# Patient Record
Sex: Female | Born: 1965
Health system: Southern US, Community
[De-identification: ages and names within clinical notes are randomized; demographics above are authoritative.]

## PROBLEM LIST (undated history)

## (undated) DIAGNOSIS — D649 Anemia, unspecified: Secondary | ICD-10-CM

## (undated) DIAGNOSIS — I10 Essential (primary) hypertension: Secondary | ICD-10-CM

## (undated) HISTORY — DX: Essential (primary) hypertension: I10

## (undated) HISTORY — DX: Anemia, unspecified: D64.9

## (undated) HISTORY — PX: COSMETIC SURGERY: SHX468

---

## 2018-02-27 ENCOUNTER — Ambulatory Visit: Payer: 59 | Admitting: Family Medicine

## 2018-02-27 ENCOUNTER — Encounter: Payer: Self-pay | Admitting: Family Medicine

## 2018-02-27 ENCOUNTER — Other Ambulatory Visit: Payer: Self-pay

## 2018-02-27 VITALS — BP 221/106 | HR 99 | Temp 99.2°F | Ht 65.0 in | Wt 160.6 lb

## 2018-02-27 DIAGNOSIS — N898 Other specified noninflammatory disorders of vagina: Secondary | ICD-10-CM

## 2018-02-27 DIAGNOSIS — I10 Essential (primary) hypertension: Secondary | ICD-10-CM

## 2018-02-27 DIAGNOSIS — Z113 Encounter for screening for infections with a predominantly sexual mode of transmission: Secondary | ICD-10-CM | POA: Diagnosis not present

## 2018-02-27 LAB — POCT URINALYSIS DIP (MANUAL ENTRY)
Bilirubin, UA: NEGATIVE
Blood, UA: NEGATIVE
Glucose, UA: NEGATIVE mg/dL
Ketones, POC UA: NEGATIVE mg/dL
Leukocytes, UA: NEGATIVE
Nitrite, UA: NEGATIVE
Protein Ur, POC: NEGATIVE mg/dL
Spec Grav, UA: 1.02 (ref 1.010–1.025)
Urobilinogen, UA: 0.2 E.U./dL
pH, UA: 7 (ref 5.0–8.0)

## 2018-02-27 MED ORDER — AMLODIPINE BESYLATE 10 MG PO TABS: 10.0000 mg | ORAL_TABLET | Freq: Every day | ORAL | 2 refills | Status: DC

## 2018-02-27 MED ORDER — CHLORTHALIDONE 25 MG PO TABS: 25.0000 mg | ORAL_TABLET | Freq: Every day | ORAL | 2 refills | Status: DC

## 2018-02-27 NOTE — Patient Instructions (Addendum)
   IF you received an x-ray today, you will receive an invoice from [facility]. Please contact [facility] at [phone] with questions or concerns regarding your invoice.   IF you received labwork today, you will receive an invoice from [facility]. Please contact [facility] at [phone] with questions or concerns regarding your invoice.   Our billing staff will not be able to assist you with questions regarding bills from these companies.  You will be contacted with the lab results as soon as they are available. The fastest way to get your results is to activate your My Chart account. Instructions are located on the last page of this paperwork. If you have not heard from us regarding the results in 2 weeks, please contact this office.      Managing Your Hypertension Hypertension is commonly called high blood pressure. This is when the force of your blood pressing against the walls of your arteries is too strong. Arteries are blood vessels that carry blood from your heart throughout your body. Hypertension forces the heart to work harder to pump blood, and may cause the arteries to become narrow or stiff. Having untreated or uncontrolled hypertension can cause heart attack, stroke, kidney disease, and other problems. What are blood pressure readings? A blood pressure reading consists of a higher number over a lower number. Ideally, your blood pressure should be below 120/80. The first ("top") number is called the systolic pressure. It is a measure of the pressure in your arteries as your heart beats. The second ("bottom") number is called the diastolic pressure. It is a measure of the pressure in your arteries as the heart relaxes. What does my blood pressure reading mean? Blood pressure is classified into four stages. Based on your blood pressure reading, your health care provider may use the following stages to determine what type of treatment you need, if any. Systolic  pressure and diastolic pressure are measured in a unit called mm Hg. Normal  Systolic pressure: below 120.  Diastolic pressure: below 80. Elevated  Systolic pressure: 120-129.  Diastolic pressure: below 80. Hypertension stage 1  Systolic pressure: 130-139.  Diastolic pressure: 80-89. Hypertension stage 2  Systolic pressure: 140 or above.  Diastolic pressure: 90 or above. What health risks are associated with hypertension? Managing your hypertension is an important responsibility. Uncontrolled hypertension can lead to:  A heart attack.  A stroke.  A weakened blood vessel (aneurysm).  Heart failure.  Kidney damage.  Eye damage.  Metabolic syndrome.  Memory and concentration problems.  What changes can I make to manage my hypertension? Hypertension can be managed by making lifestyle changes and possibly by taking medicines. Your health care provider will help you make a plan to bring your blood pressure within a normal range. Eating and drinking  Eat a diet that is high in fiber and potassium, and low in salt (sodium), added sugar, and fat. An example eating plan is called the DASH (Dietary Approaches to Stop Hypertension) diet. To eat this way: ? Eat plenty of fresh fruits and vegetables. Try to fill half of your plate at each meal with fruits and vegetables. ? Eat whole grains, such as whole wheat pasta, brown rice, or whole grain bread. Fill about one quarter of your plate with whole grains. ? Eat low-fat diary products. ? Avoid fatty cuts of meat, processed or cured meats, and poultry with skin. Fill about one quarter of your plate with lean proteins such as fish, chicken without skin, beans, eggs,   and tofu. ? Avoid premade and processed foods. These tend to be higher in sodium, added sugar, and fat.  Reduce your daily sodium intake. Most people with hypertension should eat less than 1,500 mg of sodium a day.  Limit alcohol intake to no more than 1 drink a day  for nonpregnant women and 2 drinks a day for men. One drink equals 12 oz of beer, 5 oz of wine, or 1 oz of hard liquor. Lifestyle  Work with your health care provider to maintain a healthy body weight, or to lose weight. Ask what an ideal weight is for you.  Get at least 30 minutes of exercise that causes your heart to beat faster (aerobic exercise) most days of the week. Activities may include walking, swimming, or biking.  Include exercise to strengthen your muscles (resistance exercise), such as weight lifting, as part of your weekly exercise routine. Try to do these types of exercises for 30 minutes at least 3 days a week.  Do not use any products that contain nicotine or tobacco, such as cigarettes and e-cigarettes. If you need help quitting, ask your health care provider.  Control any long-term (chronic) conditions you have, such as high cholesterol or diabetes. Monitoring  Monitor your blood pressure at home as told by your health care provider. Your personal target blood pressure may vary depending on your medical conditions, your age, and other factors.  Have your blood pressure checked regularly, as often as told by your health care provider. Working with your health care provider  Review all the medicines you take with your health care provider because there may be side effects or interactions.  Talk with your health care provider about your diet, exercise habits, and other lifestyle factors that may be contributing to hypertension.  Visit your health care provider regularly. Your health care provider can help you create and adjust your plan for managing hypertension. Will I need medicine to control my blood pressure? Your health care provider may prescribe medicine if lifestyle changes are not enough to get your blood pressure under control, and if:  Your systolic blood pressure is 130 or higher.  Your diastolic blood pressure is 80 or higher.  Take medicines only as told  by your health care provider. Follow the directions carefully. Blood pressure medicines must be taken as prescribed. The medicine does not work as well when you skip doses. Skipping doses also puts you at risk for problems. Contact a health care provider if:  You think you are having a reaction to medicines you have taken.  You have repeated (recurrent) headaches.  You feel dizzy.  You have swelling in your ankles.  You have trouble with your vision. Get help right away if:  You develop a severe headache or confusion.  You have unusual weakness or numbness, or you feel faint.  You have severe pain in your chest or abdomen.  You vomit repeatedly.  You have trouble breathing. Summary  Hypertension is when the force of blood pumping through your arteries is too strong. If this condition is not controlled, it may put you at risk for serious complications.  Your personal target blood pressure may vary depending on your medical conditions, your age, and other factors. For most people, a normal blood pressure is less than 120/80.  Hypertension is managed by lifestyle changes, medicines, or both. Lifestyle changes include weight loss, eating a healthy, low-sodium diet, exercising more, and limiting alcohol. This information is not intended to replace advice   given to you by your health care provider. Make sure you discuss any questions you have with your health care provider. Document Released: 06/13/2012 Document Revised: 08/17/2016 Document Reviewed: 08/17/2016 Elsevier Interactive Patient Education  2018 Elsevier Inc.  

## 2018-02-27 NOTE — Progress Notes (Signed)
5/28/20199:41 AM  Jenny Brock May 18, 1966, 52 y.o. female [id number]  Chief Complaint  Patient presents with  . Hypertension    needs to est care    HPI:   Patient is a 52 y.o. 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 Would like to be tested for STD  Fall Risk  02/27/2018  Falls in the past year? No     Depression screen PHQ 2/9 02/27/2018  Decreased Interest 0  Down, Depressed, Hopeless 0  PHQ - 2 Score 0    No Known Allergies  Prior to Admission medications   Not on File    Past Medical History:  Diagnosis Date  . Anemia   . Hypertension     Past Surgical History:  Procedure Laterality Date  . CESAREAN SECTION    . COSMETIC SURGERY      Social History   Tobacco Use  . Smoking status: Never Smoker  . Smokeless tobacco: Never Used  Substance Use Topics  . Alcohol use: Yes    Family History  Problem Relation Age of Onset  . Diabetes Mother   . Heart disease Mother   . Hyperlipidemia Mother   . Hypertension Mother   . Cancer Father   . Heart disease Maternal Grandmother   . Hyperlipidemia Maternal Grandmother   . Hypertension Maternal Grandmother     Review of Systems  Constitutional: Negative for chills, fever and malaise/fatigue.  Eyes: Negative for blurred vision and double vision.  Respiratory: Negative for cough and shortness of breath.   Cardiovascular: Negative for chest pain, palpitations, orthopnea, leg swelling and PND.  Gastrointestinal: Negative for abdominal pain, nausea and  vomiting.  Neurological: Positive for dizziness. Negative for sensory change, speech change, focal weakness and headaches.  All other systems reviewed and are negative.    OBJECTIVE:  Blood pressure (!) 221/106, pulse 99, temperature 99.2 F (37.3 C), temperature source Oral, height  (1.651 m), weight 160 lb 9.6 oz (72.8 kg), SpO2 99 %.  Physical Exam  Constitutional: She is oriented to person, place, and time. She appears well-developed and well-nourished.  HENT:  Head: Normocephalic and atraumatic.  Right Ear: Hearing, tympanic membrane, external ear and ear canal normal.  Left Ear: Hearing, tympanic membrane, external ear and ear canal normal.  Mouth/Throat: Oropharynx is clear and moist.  Eyes: Pupils are equal, round, and reactive to light. Conjunctivae and EOM are normal.  Neck: Neck supple. No thyromegaly present.  Cardiovascular: Normal rate, regular rhythm, normal heart sounds and intact distal pulses. Exam reveals no gallop and no friction rub.  No murmur heard. Pulmonary/Chest: Effort normal and breath sounds normal. She has no wheezes. She has no rales.  Abdominal: Soft. Bowel sounds are normal. She exhibits no distension and no mass. There is no tenderness.  Musculoskeletal: Normal range of motion. She exhibits no edema.  Lymphadenopathy:    She has no cervical adenopathy.  Neurological: She is alert and oriented to person, place, and time. She has normal reflexes. No  cranial nerve deficit. Gait normal.  Skin: Skin is warm and dry.  Psychiatric: She has a normal mood and affect.  Nursing note and vitals reviewed.   Results for orders placed or performed in visit on 02/27/18 (from the past 24 hour(s))  POCT urinalysis dipstick     Status: Normal   Collection Time: 02/27/18 11:04 AM  Result Value Ref Range   Color, UA yellow yellow   Clarity, UA clear clear   Glucose, UA negative negative mg/dL   Bilirubin, UA negative negative   Ketones, POC UA negative  negative mg/dL   Spec Grav, UA 4.401 0.272 - 1.025   Blood, UA negative negative   pH, UA 7.0 5.0 - 8.0   Protein Ur, POC negative negative mg/dL   Urobilinogen, UA 0.2 0.2 or 1.0 E.U./dL   Nitrite, UA Negative Negative   Leukocytes, UA Negative Negative    My interpretation of EKG:  Sinus, HR 102, normal intervals, LVH  ASSESSMENT and PLAN  1. Hypertension, unspecified type Asymptomatic, strict ER precautions given. Discussed new med r/se/b. Discussed low salt diet. Patient educational handout given. Labs per below.  - EKG 12-Lead - CBC - Comprehensive metabolic panel - TSH - Lipid panel - POCT urinalysis dipstick  2. Vaginal discharge - WET PREP FOR TRICH, YEAST, CLUE  3. Screen for STD (sexually transmitted disease) - HIV antibody - HCV Ab w Reflex to Quant PCR - RPR - GC/Chlamydia Probe Amp([facility])  Other orders - amLODipine (NORVASC) 10 MG tablet; Take 1 tablet (10 mg total) by mouth daily. - chlorthalidone (HYGROTON) 25 MG tablet; Take 1 tablet (25 mg total) by mouth daily.  Return in about 2 weeks (around 03/13/2018).    Myles Lipps, MD Primary Care at [facility] [address] [place], [place] [address] Ph.  [phone] Fax [phone]

## 2018-02-28 LAB — COMPREHENSIVE METABOLIC PANEL
ALT: 17 IU/L (ref 0–32)
AST: 21 IU/L (ref 0–40)
Albumin/Globulin Ratio: 1.1 — ABNORMAL LOW (ref 1.2–2.2)
Albumin: 4.2 g/dL (ref 3.5–5.5)
Alkaline Phosphatase: 67 IU/L (ref 39–117)
BUN/Creatinine Ratio: 16 (ref 9–23)
BUN: 13 mg/dL (ref 6–24)
Bilirubin Total: 0.3 mg/dL (ref 0.0–1.2)
CO2: 22 mmol/L (ref 20–29)
Calcium: 9.7 mg/dL (ref 8.7–10.2)
Chloride: 102 mmol/L (ref 96–106)
Creatinine, Ser: 0.81 mg/dL (ref 0.57–1.00)
GFR calc Af Amer: 98 mL/min/{1.73_m2} (ref >59)
GFR calc non Af Amer: 85 mL/min/{1.73_m2} (ref >59)
Globulin, Total: 4 g/dL (ref 1.5–4.5)
Glucose: 95 mg/dL (ref 65–99)
Potassium: 3.6 mmol/L (ref 3.5–5.2)
Sodium: 140 mmol/L (ref 134–144)
Total Protein: 8.2 g/dL (ref 6.0–8.5)

## 2018-02-28 LAB — RPR: RPR Ser Ql: NONREACTIVE

## 2018-02-28 LAB — CBC
Hematocrit: 36.3 % (ref 34.0–46.6)
Hemoglobin: 12.1 g/dL (ref 11.1–15.9)
MCH: 30.2 pg (ref 26.6–33.0)
MCHC: 33.3 g/dL (ref 31.5–35.7)
MCV: 91 fL (ref 79–97)
Platelets: 199 10*3/uL (ref 150–450)
RBC: 4.01 x10E6/uL (ref 3.77–5.28)
RDW: 12.5 % (ref 12.3–15.4)
WBC: 4.3 10*3/uL (ref 3.4–10.8)

## 2018-02-28 LAB — LIPID PANEL
Chol/HDL Ratio: 2.6 ratio (ref 0.0–4.4)
Cholesterol, Total: 179 mg/dL (ref 100–199)
HDL: 68 mg/dL (ref >39)
LDL Calculated: 102 mg/dL — ABNORMAL HIGH (ref 0–99)
Triglycerides: 43 mg/dL (ref 0–149)
VLDL Cholesterol Cal: 9 mg/dL (ref 5–40)

## 2018-02-28 LAB — HCV AB W REFLEX TO QUANT PCR: HCV Ab: <0.1 s/co ratio (ref 0.0–0.9)

## 2018-02-28 LAB — HCV INTERPRETATION

## 2018-02-28 LAB — HIV ANTIBODY (ROUTINE TESTING W REFLEX): HIV Screen 4th Generation wRfx: NONREACTIVE

## 2018-02-28 LAB — GC/CHLAMYDIA PROBE AMP
Chlamydia trachomatis, NAA: NEGATIVE
Neisseria gonorrhoeae by PCR: NEGATIVE

## 2018-02-28 LAB — TSH: TSH: 2.83 u[IU]/mL (ref 0.450–4.500)

## 2018-03-01 ENCOUNTER — Telehealth: Payer: Self-pay | Admitting: General Practice

## 2018-03-01 NOTE — Telephone Encounter (Signed)
Copied from CRM [id number]. Topic: Quick Communication - See Telephone Encounter >> Mar 01, 2018 11:42 AM Windy Kalata, NT wrote: CRM for notification. See Telephone encounter for: 03/01/18.  Patient is calling and states she was prescribed chlorthalidone (HYGROTON) 25 MG tablet on 02/27/18 and it is not making her go to the bathroom or anything. She would like to know does this need to be increased. Patient leaves for work at 1:30pm and states a message can be left.

## 2018-03-06 NOTE — Telephone Encounter (Signed)
That is ok. Some people have a very strong diuretic response others don't. We will see how her blood pressure is doing at her next visit. thanks

## 2018-03-07 NOTE — Telephone Encounter (Signed)
Pt advised.

## 2018-03-13 ENCOUNTER — Encounter: Payer: Self-pay | Admitting: Family Medicine

## 2018-03-13 ENCOUNTER — Other Ambulatory Visit: Payer: Self-pay

## 2018-03-13 ENCOUNTER — Ambulatory Visit: Payer: 59 | Admitting: Family Medicine

## 2018-03-13 VITALS — BP 168/82 | HR 110 | Temp 100.0°F | Resp 16 | Ht 65.0 in | Wt 153.8 lb

## 2018-03-13 DIAGNOSIS — B349 Viral infection, unspecified: Secondary | ICD-10-CM | POA: Diagnosis not present

## 2018-03-13 DIAGNOSIS — E876 Hypokalemia: Secondary | ICD-10-CM

## 2018-03-13 DIAGNOSIS — Z1231 Encounter for screening mammogram for malignant neoplasm of breast: Secondary | ICD-10-CM | POA: Diagnosis not present

## 2018-03-13 DIAGNOSIS — Z800 Family history of malignant neoplasm of digestive organs: Secondary | ICD-10-CM | POA: Diagnosis not present

## 2018-03-13 DIAGNOSIS — I10 Essential (primary) hypertension: Secondary | ICD-10-CM

## 2018-03-13 DIAGNOSIS — Z1211 Encounter for screening for malignant neoplasm of colon: Secondary | ICD-10-CM | POA: Diagnosis not present

## 2018-03-13 LAB — BASIC METABOLIC PANEL
BUN/Creatinine Ratio: 18 (ref 9–23)
BUN: 16 mg/dL (ref 6–24)
CO2: 31 mmol/L — ABNORMAL HIGH (ref 20–29)
Calcium: 9.9 mg/dL (ref 8.7–10.2)
Chloride: 90 mmol/L — ABNORMAL LOW (ref 96–106)
Creatinine, Ser: 0.91 mg/dL (ref 0.57–1.00)
GFR calc Af Amer: 85 mL/min/{1.73_m2} (ref >59)
GFR calc non Af Amer: 74 mL/min/{1.73_m2} (ref >59)
Glucose: 92 mg/dL (ref 65–99)
Potassium: 2.9 mmol/L — ABNORMAL LOW (ref 3.5–5.2)
Sodium: 136 mmol/L (ref 134–144)

## 2018-03-13 MED ORDER — LISINOPRIL 10 MG PO TABS: 10.0000 mg | ORAL_TABLET | Freq: Every day | ORAL | 1 refills | Status: DC

## 2018-03-13 NOTE — Progress Notes (Signed)
6/11/20199:02 AM  Jenny Brock [birth year], 52 y.o. female [id number]  Chief Complaint  Patient presents with  . Hypertension    2 week f/u    HPI:   Patient is a 52 y.o. female with past medical history significant for HTN who presents today for followup  Tolerating new meds well, taking as prescribed. Having some dry mouth issues but not enough to change/stop meds Otherwise, 3 days ago started feeling ill, malaise, mild diarrhea and cough, feverish Started after she got soaked in the rain She is feeling better today She denies any ear pain, sore throat, SOB, vomiting, dysuria or hematuria She is due for mammogram, last one years ago. Denies any breast concerns. Denies any fhx br or ovarian cancer She is also due for colonoscopy. Her father died of colon cancer in his 5070s. We also discussed labs results from last visit. Normal.  Fall Risk  03/13/2018 02/27/2018  Falls in the past year? No No     Dep[facility] screen [facility] 2/9 03/13/2018 02/27/2018  Decreased Interest 0 0  Down, Depressed, Hopeless 0 0  PHQ - 2 Score 0 0    No Known Allergies  Prior to Admission medications   Medication Sig Start Date End Date Taking? Authorizing Provider  amLODipine (NORVASC) 10 MG tablet Take 1 tablet (10 mg total) by mouth daily. 02/27/18  Yes Myles LippsSantiago, Thaddaeus Granja M, MD  chlorthalidone (HYGROTON) 25 MG tablet Take 1 tablet (25 mg total) by mouth daily. 02/27/18  Yes Myles LippsSantiago, Eliyanah Elgersma M, MD    Past Medical History:  Diagnosis Date  . Anemia   . Hypertension     Past Surgical History:  Procedure Laterality Date  . CESAREAN SECTION    . COSMETIC SURGERY      Social History   Tobacco Use  . Smoking status: Never Smoker  . Smokeless tobacco: Never Used  Substance Use Topics  . Alcohol use: Yes    Family History  Problem Relation Age of Onset  . Diabetes Mother   . Heart disease Mother   . Hyperlipidemia Mother   . Hypertension Mother   . Cancer Father   . Heart disease Maternal  Grandmother   . Hyperlipidemia Maternal Grandmother   . Hypertension Maternal Grandmother     Review of Systems  Cardiovascular: Negative for chest pain, palpitations and leg swelling.   Per hpi   OBJECTIVE:  Blood pressure (!) 168/82, pulse (!) 110, temperature 100 F (37.8 C), temperature source Oral, resp. rate 16, height 5\' 5"  (1.651 m), weight 153 lb 12.8 oz (69.8 kg), last menstrual period 02/06/2018, SpO2 98 %.  Repeat BP and HR: 148/72, 86  Physical Exam  Constitutional: She is oriented to person, place, and time. She appears well-developed and well-nourished.  HENT:  Head: Normocephalic and atraumatic.  Right Ear: Hearing, tympanic membrane, external ear and ear canal normal.  Left Ear: Hearing, tympanic membrane, external ear and ear canal normal.  Mouth/Throat: Oropharynx is clear and moist.  Eyes: Pupils are equal, round, and reactive to light. Conjunctivae and EOM are normal.  Neck: Neck supple.  Cardiovascular: Regular rhythm and normal heart sounds. Tachycardia present. Exam reveals no gallop and no friction rub.  No murmur heard. Pulmonary/Chest: Effort normal and breath sounds normal. She has no wheezes. She has no rales.  Musculoskeletal: She exhibits no edema.  Lymphadenopathy:    She has no cervical adenopathy.  Neurological: She is alert and oriented to person, place, and time.  Skin: Skin is warm and  dry.  Psychiatric: She has a normal mood and affect.  Nursing note and vitals reviewed.    ASSESSMENT and PLAN  1. Essential hypertension Improved, still above goal, adding lisinopril 10mg . Advised to take at bedtime. Continue with amlodipine and chlorthalidone in AM. Discussed new med r/se/b. Continue with LFM. Checking K and crt today. - Basic Metabolic Panel  2. Viral illness Resolving. Continue with supportive measures. RTC precautions discussed.   3. Visit for screening mammogram - MM Digital Screening; Future  4. Screening for colon  cancer 5. FHx: colon cancer - Ambulatory referral to Gastroenterology  Other orders - lisinopril (PRINIVIL,ZESTRIL) 10 MG tablet; Take 1 tablet (10 mg total) by mouth daily.  Return in about 1 month (around 04/10/2018).    Myles Lipps, MD Primary Care at [facility] [address] [place], [place] [address] Ph.  [phone] Fax [phone]

## 2018-03-13 NOTE — Patient Instructions (Signed)
     IF you received an x-ray today, you will receive an invoice from [facility]. Please contact [facility] at [phone] with questions or concerns regarding your invoice.   IF you received labwork today, you will receive an invoice from [facility]. Please contact [facility] at [phone] with questions or concerns regarding your invoice.   Our billing staff will not be able to assist you with questions regarding bills from these companies.  You will be contacted with the lab results as soon as they are available. The fastest way to get your results is to activate your My Chart account. Instructions are located on the last page of this paperwork. If you have not heard from us regarding the results in 2 weeks, please contact this office.     

## 2018-03-14 MED ORDER — POTASSIUM CHLORIDE CRYS ER 10 MEQ PO TBCR: 30.0000 meq | EXTENDED_RELEASE_TABLET | Freq: Two times a day (BID) | ORAL | 1 refills | Status: DC

## 2018-03-14 NOTE — Addendum Note (Signed)
Addended by: Myles LippsSANTIAGO, Hye Trawick M on: 03/14/2018 12:27 AM   Modules accepted: Orders

## 2018-03-14 NOTE — Addendum Note (Signed)
Addended by: Myles LippsSANTIAGO, Kathlene Yano M on: 03/14/2018 12:24 AM   Modules accepted: Orders

## 2018-03-17 ENCOUNTER — Ambulatory Visit (INDEPENDENT_AMBULATORY_CARE_PROVIDER_SITE_OTHER): Payer: 59 | Admitting: Family Medicine

## 2018-03-17 ENCOUNTER — Other Ambulatory Visit: Payer: Self-pay | Admitting: Family Medicine

## 2018-03-17 DIAGNOSIS — E876 Hypokalemia: Secondary | ICD-10-CM

## 2018-03-17 NOTE — Progress Notes (Signed)
Lab only visit 

## 2018-03-18 LAB — BASIC METABOLIC PANEL
BUN/Creatinine Ratio: 23 (ref 9–23)
BUN: 19 mg/dL (ref 6–24)
CO2: 29 mmol/L (ref 20–29)
Calcium: 9.7 mg/dL (ref 8.7–10.2)
Chloride: 99 mmol/L (ref 96–106)
Creatinine, Ser: 0.81 mg/dL (ref 0.57–1.00)
GFR calc Af Amer: 98 mL/min/{1.73_m2} (ref >59)
GFR calc non Af Amer: 85 mL/min/{1.73_m2} (ref >59)
Glucose: 75 mg/dL (ref 65–99)
Potassium: 4.2 mmol/L (ref 3.5–5.2)
Sodium: 140 mmol/L (ref 134–144)

## 2018-03-21 ENCOUNTER — Other Ambulatory Visit: Payer: Self-pay | Admitting: Family Medicine

## 2018-03-22 NOTE — Telephone Encounter (Signed)
Pharmacy requesting a 90 supply prescription.  Chlorthatlidone Last OV:03/13/18 Last refill:02/27/18 30 day/2 refills ZOX:[id number]:[facility] Pharmacy: [id number] [place], [place] - [address] [phone] (Phone) [phone] (Fax)

## 2018-03-23 ENCOUNTER — Other Ambulatory Visit: Payer: Self-pay

## 2018-04-04 ENCOUNTER — Other Ambulatory Visit: Payer: Self-pay

## 2018-04-04 ENCOUNTER — Telehealth: Payer: Self-pay | Admitting: Family Medicine

## 2018-04-04 MED ORDER — AMLODIPINE BESYLATE 10 MG PO TABS: 10.0000 mg | ORAL_TABLET | Freq: Every day | ORAL | 2 refills | Status: DC

## 2018-04-04 NOTE — Telephone Encounter (Signed)
Copied from CRM [id number]. Topic: Quick Communication - Rx Refill/Question >> Apr 04, 2018 12:16 PM Alexander BergeronBarksdale, Jenny B wrote: Medication: amLODipine (NORVASC) 10 MG tablet [[id number]][[id number]]   Has the patient contacted their pharmacy? Yes.   (Agent: If no, request that the patient contact the pharmacy for the refill.) (Agent: If yes, when and what did the pharmacy advise?)  [facility]  Agent: Please be advised that RX refills may take up to 3 business days. We ask that you follow-up with your pharmacy.

## 2018-04-10 ENCOUNTER — Ambulatory Visit: Payer: 59 | Admitting: Family Medicine

## 2018-04-13 ENCOUNTER — Ambulatory Visit: Payer: Self-pay

## 2018-05-15 ENCOUNTER — Encounter: Payer: Self-pay | Admitting: Family Medicine

## 2018-08-31 ENCOUNTER — Other Ambulatory Visit: Payer: Self-pay | Admitting: Family Medicine

## 2018-09-14 ENCOUNTER — Other Ambulatory Visit: Payer: Self-pay | Admitting: Family Medicine

## 2018-09-14 NOTE — Telephone Encounter (Signed)
Call placed to patient. VM left to call office. Pt need s appointment for medication refill.

## 2018-09-17 ENCOUNTER — Ambulatory Visit: Payer: 59 | Admitting: Family Medicine

## 2018-10-08 ENCOUNTER — Other Ambulatory Visit: Payer: Self-pay | Admitting: Family Medicine

## 2018-10-18 ENCOUNTER — Other Ambulatory Visit: Payer: Self-pay | Admitting: Family Medicine

## 2018-10-18 MED ORDER — LISINOPRIL 10 MG PO TABS: 10.0000 mg | ORAL_TABLET | Freq: Every day | ORAL | 0 refills | Status: DC

## 2018-10-18 MED ORDER — CHLORTHALIDONE 25 MG PO TABS: 25.0000 mg | ORAL_TABLET | Freq: Every day | ORAL | 0 refills | Status: DC

## 2018-10-18 NOTE — Telephone Encounter (Signed)
Copied from CRM [id number]. Topic: Quick Communication - Rx Refill/Question >> Oct 18, 2018  9:03 AM Baldo Daub L wrote: Medication: lisinopril (PRINIVIL,ZESTRIL) 10 MG tablet  Has the patient contacted their pharmacy? Yes - states she needs refill from PCP (Agent: If no, request that the patient contact the pharmacy for the refill.) (Agent: If yes, when and what did the pharmacy advise?)  Preferred Pharmacy (with phone number or street name): [id number] [place],  - [address] [phone] (Phone) [phone] (Fax)  Agent: Please be advised that RX refills may take up to 3 business days. We ask that you follow-up with your pharmacy.

## 2018-10-18 NOTE — Telephone Encounter (Signed)
Requested medication (s) are due for refill today: yes  Requested medication (s) are on the active medication list: yes  Last refill:  09/14/18 courtesy refill  Future visit scheduled: yes  Notes to clinic:  Courtesy refill has already been given    Requested Prescriptions  Pending Prescriptions Disp Refills   chlorthalidone (HYGROTON) 25 MG tablet 30 tablet 0    Sig: Take 1 tablet (25 mg total) by mouth daily.     Cardiovascular: Diuretics - Thiazide Failed - 10/18/2018  9:54 AM      Failed - Last BP in normal range    BP Readings from Last 1 Encounters:  03/13/18 (!) 168/82         Failed - Valid encounter within last 6 months    Recent Outpatient Visits          7 months ago Hypokalemia   [facility] Meda Coffee, MD   7 months ago Essential hypertension   [facility], Meda Coffee, MD   7 months ago Hypertension, unspecified type   [facility], Meda Coffee, MD      Future Appointments            In 1 month Myles Lipps, MD Primary Care at [place], [facility]           Passed - Ca in normal range and within 360 days    Calcium  Date Value Ref Range Status  03/17/2018 9.7 8.7 - 10.2 mg/dL Final         Passed - Cr in normal range and within 360 days    Creatinine, Ser  Date Value Ref Range Status  03/17/2018 0.81 0.57 - 1.00 mg/dL Final         Passed - K in normal range and within 360 days    Potassium  Date Value Ref Range Status  03/17/2018 4.2 3.5 - 5.2 mmol/L Final         Passed - Na in normal range and within 360 days    Sodium  Date Value Ref Range Status  03/17/2018 140 134 - 144 mmol/L Final       Refused Prescriptions Disp Refills   chlorthalidone (HYGROTON) 25 MG tablet [Pharmacy Med Name: CHLORTHALIDONE 25 MG TABLET] 30 tablet 0    Sig: TAKE 1 TABLET BY MOUTH EVERY DAY     Cardiovascular: Diuretics - Thiazide Failed - 10/18/2018  9:54 AM      Failed - Last BP in normal range    BP Readings  from Last 1 Encounters:  03/13/18 (!) 168/82         Failed - Valid encounter within last 6 months    Recent Outpatient Visits          7 months ago Hypokalemia   [facility] Meda Coffee, MD   7 months ago Essential hypertension   [facility] Meda Coffee, MD   7 months ago Hypertension, unspecified type   [facility], Meda Coffee, MD      Future Appointments            In 1 month Myles Lipps, MD Primary Care at [place], [facility]           Passed - Ca in normal range and within 360 days    Calcium  Date Value Ref Range Status  03/17/2018 9.7 8.7 - 10.2 mg/dL Final  Passed - Cr in normal range and within 360 days    Creatinine, Ser  Date Value Ref Range Status  03/17/2018 0.81 0.57 - 1.00 mg/dL Final         Passed - K in normal range and within 360 days    Potassium  Date Value Ref Range Status  03/17/2018 4.2 3.5 - 5.2 mmol/L Final         Passed - Na in normal range and within 360 days    Sodium  Date Value Ref Range Status  03/17/2018 140 134 - 144 mmol/L Final

## 2018-10-18 NOTE — Addendum Note (Signed)
Addended by: Wilford Corner on: 10/18/2018 09:54 AM   Modules accepted: Orders

## 2018-10-18 NOTE — Telephone Encounter (Signed)
Courtesy refill. Requested Prescriptions  Pending Prescriptions Disp Refills  . lisinopril (PRINIVIL,ZESTRIL) 10 MG tablet 90 tablet 0    Sig: Take 1 tablet (10 mg total) by mouth daily.     Cardiovascular:  ACE Inhibitors Failed - 10/18/2018  9:10 AM      Failed - Cr in normal range and within 180 days    Creatinine, Ser  Date Value Ref Range Status  03/17/2018 0.81 0.57 - 1.00 mg/dL Final         Failed - K in normal range and within 180 days    Potassium  Date Value Ref Range Status  03/17/2018 4.2 3.5 - 5.2 mmol/L Final         Failed - Last BP in normal range    BP Readings from Last 1 Encounters:  03/13/18 (!) 168/82         Failed - Valid encounter within last 6 months    Recent Outpatient Visits          7 months ago Hypokalemia   [facility], Meda CoffeeIrma M, MD   7 months ago Essential hypertension   [facility], Meda CoffeeIrma M, MD   7 months ago Hypertension, unspecified type   [facility], Meda CoffeeIrma M, MD      Future Appointments            In 1 month Myles LippsSantiago, Irma M, MD [facility]           Passed - Patient is not pregnant

## 2018-10-18 NOTE — Telephone Encounter (Signed)
Pt called in and made medication refill OV, first available 11/23/2018.  Pt wants to know if she can get enough medication to get her through to that appointment.

## 2018-11-02 ENCOUNTER — Encounter: Payer: Self-pay | Admitting: Family Medicine

## 2018-11-02 ENCOUNTER — Ambulatory Visit: Payer: 59 | Admitting: Family Medicine

## 2018-11-02 VITALS — BP 154/82 | HR 68 | Temp 98.1°F | Ht 65.0 in | Wt 157.0 lb

## 2018-11-02 DIAGNOSIS — Z1239 Encounter for other screening for malignant neoplasm of breast: Secondary | ICD-10-CM

## 2018-11-02 DIAGNOSIS — I10 Essential (primary) hypertension: Secondary | ICD-10-CM | POA: Diagnosis not present

## 2018-11-02 DIAGNOSIS — Z1211 Encounter for screening for malignant neoplasm of colon: Secondary | ICD-10-CM | POA: Diagnosis not present

## 2018-11-02 MED ORDER — LISINOPRIL 10 MG PO TABS: 10.0000 mg | ORAL_TABLET | Freq: Every day | ORAL | 1 refills | Status: DC

## 2018-11-02 NOTE — Patient Instructions (Signed)
-  It was great to meet you today! -I have refilled your lisinopril -You should receive cologuard in the mail and get a call to arrange for a mammogram.  -See me again in about 4 weeks for physical and pap.  We'll check labs at that time as well.

## 2018-11-02 NOTE — Assessment & Plan Note (Signed)
-  BP elevated today, however out of lisinopril. -Lisinopril renewed.  -We'll recheck and update labs a upcoming CPE

## 2018-11-02 NOTE — Assessment & Plan Note (Signed)
Updated mammogram ordered. 

## 2018-11-02 NOTE — Progress Notes (Signed)
Jenny Brock - 53 y.o. female MRN [id number]  Date of birth: Aug 18, 1966  Subjective Chief Complaint  Patient presents with  . Establish Care    wants to discuss hypertension and refills    HPI Jenny Brock is a 53 y.o. female with history of HTN here today to establish care with new pcp and f/u HTN.  Moved from [place], [place] about at year ago.  Works at [employer].    -HTN:   Current treatment with chlorthalidone and lisinopril.  She is currently out of lisinopril.  Had labs 03/2018, reviewed.  Denies side effects from medication.  She denies chest pain, shortness of breath, palpitations, headache or vision changes.   -Due for colon cancer screening.  She would prefer to have cologuard.  Denies family history of colon cancer.  Father had prostate cancer rather than colon cancer.   -Needs updated mammogram as well.  -She plans on returning in a few weeks for annual exam with pap and labs.    ROS:  A comprehensive ROS was completed and negative except as noted per HPI  No Known Allergies  Past Medical History:  Diagnosis Date  . Anemia   . Hypertension     Past Surgical History:  Procedure Laterality Date  . CESAREAN SECTION    . COSMETIC SURGERY      Social History   Socioeconomic History  . Marital status: Single    Spouse name: Not on file  . Number of children: Not on file  . Years of education: Not on file  . Highest education level: Not on file  Occupational History  . Not on file  Social Needs  . Financial resource strain: Not on file  . Food insecurity:    Worry: Not on file    Inability: Not on file  . Transportation needs:    Medical: Not on file    Non-medical: Not on file  Tobacco Use  . Smoking status: Never Smoker  . Smokeless tobacco: Never Used  Substance and Sexual Activity  . Alcohol use: Yes  . Drug use: Not Currently  . Sexual activity: Not Currently  Lifestyle  . Physical activity:    Days per week: Not on file   Minutes per session: Not on file  . Stress: Not on file  Relationships  . Social connections:    Talks on phone: Not on file    Gets together: Not on file    Attends religious service: Not on file    Active member of club or organization: Not on file    Attends meetings of clubs or organizations: Not on file    Relationship status: Not on file  Other Topics Concern  . Not on file  Social History Narrative  . Not on file    Family History  Problem Relation Age of Onset  . Diabetes Mother   . Heart disease Mother   . Hyperlipidemia Mother   . Hypertension Mother   . Cancer Father        prostate  . Heart disease Maternal Grandmother   . Hyperlipidemia Maternal Grandmother   . Hypertension Maternal Grandmother     Health Maintenance  Topic Date Due  . TETANUS/TDAP  10/28/1986  . PAP SMEAR-Modifier  10/28/1988  . MAMMOGRAM  10/28/2017  . COLONOSCOPY  10/28/2017  . INFLUENZA VACCINE  01/01/2019 (Originally 05/03/2018)  . HIV Screening  Completed    ----------------------------------------------------------------------------------------------------------------------------------------------------------------------------------------------------------------- Physical Exam BP (!) 154/82   Pulse 68  Temp 98.1 F (36.7 C) (Oral)   Ht 5\' 5"  (1.651 m)   Wt 157 lb (71.2 kg)   SpO2 98%   BMI 26.13 kg/m   Physical Exam Constitutional:      Appearance: Normal appearance.  HENT:     Head: Normocephalic and atraumatic.     Mouth/Throat:     Mouth: Mucous membranes are moist.  Eyes:     General: No scleral icterus. Neck:     Musculoskeletal: Neck supple.  Cardiovascular:     Rate and Rhythm: Normal rate and regular rhythm.  Pulmonary:     Effort: Pulmonary effort is normal.     Breath sounds: Normal breath sounds.  Skin:    General: Skin is warm and dry.  Neurological:     General: No focal deficit present.     Mental Status: She is alert.  Psychiatric:        Mood  and Affect: Mood normal.     ------------------------------------------------------------------------------------------------------------------------------------------------------------------------------------------------------------------- Assessment and Plan  Essential hypertension -BP elevated today, however out of lisinopril. -Lisinopril renewed.  -We'll recheck and update labs a upcoming CPE  Breast cancer screening -Updated mammogram ordered  Colon cancer screening -elects to have cologuard, ordered.

## 2018-11-02 NOTE — Assessment & Plan Note (Signed)
-  elects to have cologuard, ordered.

## 2018-11-11 ENCOUNTER — Other Ambulatory Visit: Payer: Self-pay | Admitting: Family Medicine

## 2018-11-12 ENCOUNTER — Other Ambulatory Visit: Payer: Self-pay | Admitting: Family Medicine

## 2018-11-12 NOTE — Telephone Encounter (Signed)
Requested medication (s) are due for refill today -yes  Requested medication (s) are on the active medication list -yes  Future visit scheduled -yes  Last refill: 11/02/18  Notes to clinic: Patient is requesting a RF of medication previously filled by outside provider- sent for RF by PCP  Requested Prescriptions  Pending Prescriptions Disp Refills   chlorthalidone (HYGROTON) 25 MG tablet [Pharmacy Med Name: CHLORTHALIDONE 25 MG TABLET] 30 tablet 0    Sig: TAKE 1 TABLET BY MOUTH EVERY DAY     Cardiovascular: Diuretics - Thiazide Failed - 11/11/2018  1:34 PM      Failed - Last BP in normal range    BP Readings from Last 1 Encounters:  11/02/18 (!) 154/82         Passed - Ca in normal range and within 360 days    Calcium  Date Value Ref Range Status  03/17/2018 9.7 8.7 - 10.2 mg/dL Final         Passed - Cr in normal range and within 360 days    Creatinine, Ser  Date Value Ref Range Status  03/17/2018 0.81 0.57 - 1.00 mg/dL Final         Passed - K in normal range and within 360 days    Potassium  Date Value Ref Range Status  03/17/2018 4.2 3.5 - 5.2 mmol/L Final         Passed - Na in normal range and within 360 days    Sodium  Date Value Ref Range Status  03/17/2018 140 134 - 144 mmol/L Final         Passed - Valid encounter within last 6 months    Recent Outpatient Visits          1 week ago Essential hypertension   [facility] [place], [place], DO   8 months ago Hypokalemia   [facility], Meda Coffee, MD   8 months ago Essential hypertension   [facility], Meda Coffee, MD   8 months ago Hypertension, unspecified type   [facility], Meda Coffee, MD      Future Appointments            In 3 weeks Everrett Coombe, DO [facility] [place], [facility]            Requested Prescriptions  Pending Prescriptions Disp Refills   chlorthalidone (HYGROTON) 25 MG tablet [Pharmacy Med Name:  CHLORTHALIDONE 25 MG TABLET] 30 tablet 0    Sig: TAKE 1 TABLET BY MOUTH EVERY DAY     Cardiovascular: Diuretics - Thiazide Failed - 11/11/2018  1:34 PM      Failed - Last BP in normal range    BP Readings from Last 1 Encounters:  11/02/18 (!) 154/82         Passed - Ca in normal range and within 360 days    Calcium  Date Value Ref Range Status  03/17/2018 9.7 8.7 - 10.2 mg/dL Final         Passed - Cr in normal range and within 360 days    Creatinine, Ser  Date Value Ref Range Status  03/17/2018 0.81 0.57 - 1.00 mg/dL Final         Passed - K in normal range and within 360 days    Potassium  Date Value Ref Range Status  03/17/2018 4.2 3.5 - 5.2 mmol/L Final         Passed -  Na in normal range and within 360 days    Sodium  Date Value Ref Range Status  03/17/2018 140 134 - 144 mmol/L Final         Passed - Valid encounter within last 6 months    Recent Outpatient Visits          1 week ago Essential hypertension   [facility] [place], [place], DO   8 months ago Hypokalemia   [facility], Meda Coffee, MD   8 months ago Essential hypertension   [facility], Meda Coffee, MD   8 months ago Hypertension, unspecified type   [facility], Meda Coffee, MD      Future Appointments            In 3 weeks Everrett Coombe, DO [facility], [facility]

## 2018-11-14 LAB — COLOGUARD: Cologuard: NEGATIVE

## 2018-11-15 ENCOUNTER — Encounter: Payer: Self-pay | Admitting: Family Medicine

## 2018-11-23 ENCOUNTER — Ambulatory Visit: Payer: 59 | Admitting: Family Medicine

## 2018-12-05 ENCOUNTER — Ambulatory Visit
Admission: RE | Admit: 2018-12-05 | Discharge: 2018-12-05 | Disposition: A | Discharge status: Home / Self Care | Payer: 59 | Source: Ambulatory Visit | Attending: Family Medicine | Admitting: Family Medicine

## 2018-12-05 ENCOUNTER — Other Ambulatory Visit: Payer: Self-pay | Admitting: Family Medicine

## 2018-12-05 DIAGNOSIS — Z1239 Encounter for other screening for malignant neoplasm of breast: Secondary | ICD-10-CM

## 2018-12-05 NOTE — Telephone Encounter (Signed)
Patient has appointment in 2 days- Courtesy RF given Requested Prescriptions  Pending Prescriptions Disp Refills  . chlorthalidone (HYGROTON) 25 MG tablet [Pharmacy Med Name: CHLORTHALIDONE 25 MG TABLET] 30 tablet 0    Sig: TAKE 1 TABLET BY MOUTH EVERY DAY     Cardiovascular: Diuretics - Thiazide Failed - 12/05/2018  8:16 AM      Failed - Last BP in normal range    BP Readings from Last 1 Encounters:  11/02/18 (!) 154/82         Passed - Ca in normal range and within 360 days    Calcium  Date Value Ref Range Status  03/17/2018 9.7 8.7 - 10.2 mg/dL Final         Passed - Cr in normal range and within 360 days    Creatinine, Ser  Date Value Ref Range Status  03/17/2018 0.81 0.57 - 1.00 mg/dL Final         Passed - K in normal range and within 360 days    Potassium  Date Value Ref Range Status  03/17/2018 4.2 3.5 - 5.2 mmol/L Final         Passed - Na in normal range and within 360 days    Sodium  Date Value Ref Range Status  03/17/2018 140 134 - 144 mmol/L Final         Passed - Valid encounter within last 6 months    Recent Outpatient Visits          1 month ago Essential hypertension   [facility] [place], [place], DO   8 months ago Hypokalemia   [facility], Meda Coffee, MD   8 months ago Essential hypertension   [facility], Meda Coffee, MD   9 months ago Hypertension, unspecified type   [facility], Meda Coffee, MD      Future Appointments            In 2 days Everrett Coombe, DO [facility] [place], [facility]

## 2018-12-07 ENCOUNTER — Ambulatory Visit: Payer: 59

## 2018-12-07 ENCOUNTER — Ambulatory Visit: Payer: 59 | Admitting: Family Medicine

## 2018-12-07 ENCOUNTER — Encounter: Payer: Self-pay | Admitting: Family Medicine

## 2018-12-07 DIAGNOSIS — I10 Essential (primary) hypertension: Secondary | ICD-10-CM

## 2018-12-07 MED ORDER — LISINOPRIL 20 MG PO TABS: 20.0000 mg | ORAL_TABLET | Freq: Every day | ORAL | 1 refills | Status: DC

## 2018-12-07 MED ORDER — CHLORTHALIDONE 25 MG PO TABS: 25.0000 mg | ORAL_TABLET | Freq: Every day | ORAL | 1 refills | Status: DC

## 2018-12-07 NOTE — Patient Instructions (Addendum)
Increase lisinopril to 20mg  (2 of the 10mg  tablets) Continue chlorthalidone Follow up with me in 3 months for annual exam.

## 2018-12-07 NOTE — Assessment & Plan Note (Signed)
-  BP remains elevated, continue chlorthalidone and increase lisinopril to 20mg .  -Low salt salt diet -F/u in 3 months.

## 2018-12-07 NOTE — Progress Notes (Signed)
Jenny Brock - 53 y.o. female MRN [id number]  Date of birth: 02-21-1966  Subjective Chief Complaint  Patient presents with  . Follow-up  . Hypertension    HPI Jenny Brock is a 53 y.o. female here today for follow up of HTN.  She reports that she is doing well with current medication however blood pressure remains elevated.  She feels that stress from her job is contributing as well.  She denies side effects from medication including cough or fatigue.  She has not had anginal symptoms, shortness of breath, palpitations, headache or vision changes.   ROS:  A comprehensive ROS was completed and negative except as noted per HPI  No Known Allergies  Past Medical History:  Diagnosis Date  . Anemia   . Hypertension     Past Surgical History:  Procedure Laterality Date  . CESAREAN SECTION    . COSMETIC SURGERY      Social History   Socioeconomic History  . Marital status: Single    Spouse name: Not on file  . Number of children: Not on file  . Years of education: Not on file  . Highest education level: Not on file  Occupational History  . Not on file  Social Needs  . Financial resource strain: Not on file  . Food insecurity:    Worry: Not on file    Inability: Not on file  . Transportation needs:    Medical: Not on file    Non-medical: Not on file  Tobacco Use  . Smoking status: Never Smoker  . Smokeless tobacco: Never Used  Substance and Sexual Activity  . Alcohol use: Yes  . Drug use: Not Currently  . Sexual activity: Not Currently  Lifestyle  . Physical activity:    Days per week: Not on file    Minutes per session: Not on file  . Stress: Not on file  Relationships  . Social connections:    Talks on phone: Not on file    Gets together: Not on file    Attends religious service: Not on file    Active member of club or organization: Not on file    Attends meetings of clubs or organizations: Not on file    Relationship status: Not on file  Other Topics  Concern  . Not on file  Social History Narrative  . Not on file    Family History  Problem Relation Age of Onset  . Diabetes Mother   . Heart disease Mother   . Hyperlipidemia Mother   . Hypertension Mother   . Cancer Father        prostate  . Heart disease Maternal Grandmother   . Hyperlipidemia Maternal Grandmother   . Hypertension Maternal Grandmother     Health Maintenance  Topic Date Due  . TETANUS/TDAP  10/28/1986  . PAP SMEAR-Modifier  10/28/1988  . INFLUENZA VACCINE  01/01/2019 (Originally 05/03/2018)  . MAMMOGRAM  12/04/2020  . Fecal DNA (Cologuard)  11/08/2021  . HIV Screening  Completed    ----------------------------------------------------------------------------------------------------------------------------------------------------------------------------------------------------------------- Physical Exam BP (!) 150/80 (BP Location: Right Arm, Patient Position: Sitting, Cuff Size: Normal)   Pulse 94   Temp 98.2 F (36.8 C) (Oral)   Ht 5' 5.5" (1.664 m)   Wt 158 lb (71.7 kg)   LMP 11/25/2018   SpO2 98%   BMI 25.89 kg/m   Physical Exam Constitutional:      Appearance: Normal appearance.  HENT:     Head: Normocephalic and atraumatic.  Mouth/Throat:     Mouth: Mucous membranes are moist.  Eyes:     General: No scleral icterus. Neck:     Musculoskeletal: Neck supple.  Cardiovascular:     Rate and Rhythm: Normal rate and regular rhythm.  Pulmonary:     Effort: Pulmonary effort is normal.     Breath sounds: Normal breath sounds.  Skin:    General: Skin is warm and dry.  Neurological:     General: No focal deficit present.     Mental Status: She is alert.  Psychiatric:        Mood and Affect: Mood normal.        Behavior: Behavior normal.      ------------------------------------------------------------------------------------------------------------------------------------------------------------------------------------------------------------------- Assessment and Plan  Essential hypertension -BP remains elevated, continue chlorthalidone and increase lisinopril to 20mg .  -Low salt salt diet -F/u in 3 months.

## 2019-03-15 ENCOUNTER — Encounter: Payer: 59 | Admitting: Family Medicine

## 2019-03-18 ENCOUNTER — Encounter: Payer: 59 | Admitting: Family Medicine

## 2019-05-25 ENCOUNTER — Other Ambulatory Visit: Payer: Self-pay | Admitting: Family Medicine

## 2019-07-06 ENCOUNTER — Other Ambulatory Visit: Payer: Self-pay | Admitting: Family Medicine

## 2019-07-08 NOTE — Telephone Encounter (Signed)
Pt is overdue for an office visit  

## 2019-07-30 ENCOUNTER — Other Ambulatory Visit: Payer: Self-pay | Admitting: Family Medicine

## 2019-08-21 ENCOUNTER — Other Ambulatory Visit: Payer: Self-pay | Admitting: Family Medicine

## 2019-08-21 NOTE — Telephone Encounter (Signed)
Please fill x30 days and schedule for f/u apt.

## 2019-09-14 ENCOUNTER — Other Ambulatory Visit: Payer: Self-pay | Admitting: Family Medicine

## 2019-09-16 ENCOUNTER — Other Ambulatory Visit: Payer: Self-pay

## 2019-10-10 ENCOUNTER — Other Ambulatory Visit: Payer: Self-pay

## 2019-10-10 ENCOUNTER — Other Ambulatory Visit: Payer: Self-pay | Admitting: Family Medicine

## 2019-10-10 NOTE — Telephone Encounter (Signed)
Refill x30 days, due for OV

## 2019-10-10 NOTE — Telephone Encounter (Signed)
Lvm for OV f/u.

## 2019-10-15 ENCOUNTER — Telehealth: Payer: Self-pay

## 2019-10-15 NOTE — Telephone Encounter (Signed)
Questions for Screening COVID-19  Symptom onset: no  Travel or Contacts: no  During this illness, did/does the patient experience any of the following symptoms? Fever >100.56F []   Yes [x]   No []   Unknown Subjective fever (felt feverish) []   Yes [x]   No []   Unknown Chills []   Yes [x]   No []   Unknown Muscle aches (myalgia) []   Yes [x]   No []   Unknown Runny nose (rhinorrhea) []   Yes [x]   No []   Unknown Sore throat []   Yes [x]   No []   Unknown Cough (new onset or worsening of chronic cough) []   Yes [x]   No []   Unknown Shortness of breath (dyspnea) []   Yes [x]   No []   Unknown Nausea or vomiting []   Yes [x]   No []   Unknown Headache []   Yes [x]   No []   Unknown Abdominal pain  []   Yes [x]   No []   Unknown Diarrhea (?3 loose/looser than normal stools/24hr period) []   Yes [x]   No []   Unknown

## 2019-10-16 ENCOUNTER — Ambulatory Visit: Payer: 59 | Admitting: Family Medicine

## 2019-10-24 ENCOUNTER — Other Ambulatory Visit: Payer: Self-pay

## 2019-10-25 ENCOUNTER — Ambulatory Visit (INDEPENDENT_AMBULATORY_CARE_PROVIDER_SITE_OTHER): Payer: BC Managed Care – PPO | Admitting: Family Medicine

## 2019-10-25 ENCOUNTER — Ambulatory Visit: Payer: 59 | Admitting: Family Medicine

## 2019-10-25 ENCOUNTER — Encounter: Payer: Self-pay | Admitting: Family Medicine

## 2019-10-25 VITALS — BP 146/84 | HR 105 | Temp 98.0°F | Ht 65.5 in | Wt 159.0 lb

## 2019-10-25 DIAGNOSIS — Z13220 Encounter for screening for lipoid disorders: Secondary | ICD-10-CM | POA: Diagnosis not present

## 2019-10-25 DIAGNOSIS — Z23 Encounter for immunization: Secondary | ICD-10-CM | POA: Diagnosis not present

## 2019-10-25 DIAGNOSIS — I10 Essential (primary) hypertension: Secondary | ICD-10-CM

## 2019-10-25 DIAGNOSIS — Z0000 Encounter for general adult medical examination without abnormal findings: Secondary | ICD-10-CM | POA: Diagnosis not present

## 2019-10-25 MED ORDER — CHLORTHALIDONE 25 MG PO TABS: 25.0000 mg | ORAL_TABLET | Freq: Every day | ORAL | 1 refills | Status: DC

## 2019-10-25 MED ORDER — LISINOPRIL 20 MG PO TABS: 20.0000 mg | ORAL_TABLET | Freq: Every day | ORAL | 1 refills | Status: DC

## 2019-10-25 NOTE — Progress Notes (Signed)
Jenny Brock - 54 y.o. female MRN [id number]  Date of birth: 06-28-1966  Subjective Chief Complaint  Patient presents with  . Annual Exam    CPE--fasting--request to come back other day for pap and breast exam? ok for tdap and shingle shot consult?    HPI Jenny Brock is a 54 y.o. female wit history of HTN here today for f/u of HTN and annual exam.  She reports that she is doing well.  She has been out of lisinopril but is taking chlorthalidone.  She would like to get updated Tdap and Shingrix vaccine today.  She is due for updated pap but would like to defer to later date.  She is UTD on mammogram.  She does exercise regularly.  She reports that she could improve her diet some.  She is a lifelong non smoker and endorses occasional EtOH use.   Review of Systems  Constitutional: Negative for chills, fever, malaise/fatigue and weight loss.  HENT: Negative for congestion, ear pain and sore throat.   Eyes: Negative for blurred vision, double vision and pain.  Respiratory: Negative for cough and shortness of breath.   Cardiovascular: Negative for chest pain and palpitations.  Gastrointestinal: Negative for abdominal pain, blood in stool, constipation, heartburn and nausea.  Genitourinary: Negative for dysuria and urgency.  Musculoskeletal: Negative for joint pain and myalgias.  Neurological: Negative for dizziness and headaches.  Endo/Heme/Allergies: Does not bruise/bleed easily.  Psychiatric/Behavioral: Negative for depression. The patient is not nervous/anxious and does not have insomnia.     No Known Allergies  Past Medical History:  Diagnosis Date  . Anemia   . Hypertension     Past Surgical History:  Procedure Laterality Date  . CESAREAN SECTION    . COSMETIC SURGERY      Social History   Socioeconomic History  . Marital status: Single    Spouse name: Not on file  . Number of children: Not on file  . Years of education: Not on file  . Highest education level: Not  on file  Occupational History  . Not on file  Tobacco Use  . Smoking status: Never Smoker  . Smokeless tobacco: Never Used  Substance and Sexual Activity  . Alcohol use: Yes    Comment: social  . Drug use: Not Currently  . Sexual activity: Not Currently  Other Topics Concern  . Not on file  Social History Narrative  . Not on file   Social Determinants of Health   Financial Resource Strain:   . Difficulty of Paying Living Expenses: Not on file  Food Insecurity:   . Worried About Charity fundraiser in the Last Year: Not on file  . Ran Out of Food in the Last Year: Not on file  Transportation Needs:   . Lack of Transportation (Medical): Not on file  . Lack of Transportation (Non-Medical): Not on file  Physical Activity:   . Days of Exercise per Week: Not on file  . Minutes of Exercise per Session: Not on file  Stress:   . Feeling of Stress : Not on file  Social Connections:   . Frequency of Communication with Friends and Family: Not on file  . Frequency of Social Gatherings with Friends and Family: Not on file  . Attends Religious Services: Not on file  . Active Member of Clubs or Organizations: Not on file  . Attends Archivist Meetings: Not on file  . Marital Status: Not on file    Family History  Problem Relation Age of Onset  . Diabetes Mother   . Heart disease Mother   . Hyperlipidemia Mother   . Hypertension Mother   . Cancer Father        prostate  . Heart disease Maternal Grandmother   . Hyperlipidemia Maternal Grandmother   . Hypertension Maternal Grandmother     Health Maintenance  Topic Date Due  . TETANUS/TDAP  10/28/1986  . PAP SMEAR-Modifier  10/28/1988  . INFLUENZA VACCINE  01/01/2020 (Originally 05/04/2019)  . MAMMOGRAM  12/04/2020  . Fecal DNA (Cologuard)  11/08/2021  . HIV Screening  Completed     ----------------------------------------------------------------------------------------------------------------------------------------------------------------------------------------------------------------- Physical Exam BP (!) 146/84   Pulse (!) 105   Temp 98 F (36.7 C) (Tympanic)   Ht 5' 5.5" (1.664 m)   Wt 159 lb (72.1 kg)   SpO2 100%   BMI 26.06 kg/m   Physical Exam Constitutional:      General: She is not in acute distress. HENT:     Head: Normocephalic and atraumatic.     Right Ear: Tympanic membrane normal.     Left Ear: Tympanic membrane normal.     Nose: Nose normal.     Mouth/Throat:     Mouth: Mucous membranes are moist.  Eyes:     General: No scleral icterus.    Conjunctiva/sclera: Conjunctivae normal.  Neck:     Thyroid: No thyromegaly.  Cardiovascular:     Rate and Rhythm: Normal rate and regular rhythm.     Heart sounds: Normal heart sounds.  Pulmonary:     Effort: Pulmonary effort is normal.     Breath sounds: Normal breath sounds.  Abdominal:     General: Bowel sounds are normal. There is no distension.     Palpations: Abdomen is soft.     Tenderness: There is no abdominal tenderness. There is no guarding.  Musculoskeletal:        General: Normal range of motion.     Cervical back: Normal range of motion and neck supple.  Lymphadenopathy:     Cervical: No cervical adenopathy.  Skin:    General: Skin is warm and dry.     Findings: No rash.  Neurological:     Mental Status: She is alert and oriented to person, place, and time.     Cranial Nerves: No cranial nerve deficit.     Coordination: Coordination normal.  Psychiatric:        Mood and Affect: Mood normal.        Behavior: Behavior normal.     ------------------------------------------------------------------------------------------------------------------------------------------------------------------------------------------------------------------- Assessment and  Plan  Essential hypertension Mild elevation in BP, currently out of lisinopril.  Will restart lisinopril with continuation of chlorthalidone.  Recommend f/u in 6 months or sooner if needed.   Well adult exam Well adult Orders Placed This Encounter  Procedures  . Tdap vaccine greater than or equal to 7yo IM  . Varicella-zoster vaccine IM  . CBC  . Comp Met (CMET)  . Lipid panel  . TSH  Screening: Lipid.  She defers pap to later date.   Immunizations: Tdap and Shingrix.   Anticipatory guidance/Risk factor reduction:  Recommend high fiber diet rich in fruits and vegetables.  Regular exercise and dental visits.  Additional recommendations per AVS    This visit occurred during the SARS-CoV-2 public health emergency.  Safety protocols were in place, including screening questions prior to the visit, additional usage of staff PPE, and extensive cleaning of exam room while observing appropriate contact time as  indicated for disinfecting solutions.

## 2019-10-25 NOTE — Assessment & Plan Note (Signed)
Mild elevation in BP, currently out of lisinopril.  Will restart lisinopril with continuation of chlorthalidone.  Recommend f/u in 6 months or sooner if needed.

## 2019-10-25 NOTE — Patient Instructions (Signed)

## 2019-10-25 NOTE — Assessment & Plan Note (Signed)
Well adult Orders Placed This Encounter  Procedures  . Tdap vaccine greater than or equal to 52yo IM  . Varicella-zoster vaccine IM  . CBC  . Comp Met (CMET)  . Lipid panel  . TSH  Screening: Lipid.  She defers pap to later date.   Immunizations: Tdap and Shingrix.   Anticipatory guidance/Risk factor reduction:  Recommend high fiber diet rich in fruits and vegetables.  Regular exercise and dental visits.  Additional recommendations per AVS

## 2019-10-26 LAB — COMPREHENSIVE METABOLIC PANEL
AG Ratio: 1 (calc) (ref 1.0–2.5)
ALT: 15 U/L (ref 6–29)
AST: 22 U/L (ref 10–35)
Albumin: 4.1 g/dL (ref 3.6–5.1)
Alkaline phosphatase (APISO): 52 U/L (ref 37–153)
BUN: 16 mg/dL (ref 7–25)
CO2: 30 mmol/L (ref 20–32)
Calcium: 10.2 mg/dL (ref 8.6–10.4)
Chloride: 103 mmol/L (ref 98–110)
Creat: 0.93 mg/dL (ref 0.50–1.05)
Globulin: 4 g/dL (calc) — ABNORMAL HIGH (ref 1.9–3.7)
Glucose, Bld: 81 mg/dL (ref 65–99)
Potassium: 3.2 mmol/L — ABNORMAL LOW (ref 3.5–5.3)
Sodium: 140 mmol/L (ref 135–146)
Total Bilirubin: 0.6 mg/dL (ref 0.2–1.2)
Total Protein: 8.1 g/dL (ref 6.1–8.1)

## 2019-10-26 LAB — LIPID PANEL
Cholesterol: 196 mg/dL (ref <200)
HDL: 70 mg/dL (ref >=50)
LDL Cholesterol (Calc): 114 mg/dL (calc) — ABNORMAL HIGH
Non-HDL Cholesterol (Calc): 126 mg/dL (calc) (ref <130)
Total CHOL/HDL Ratio: 2.8 (calc) (ref <5.0)
Triglycerides: 41 mg/dL (ref <150)

## 2019-10-26 LAB — CBC
HCT: 32.8 % — ABNORMAL LOW (ref 35.0–45.0)
Hemoglobin: 11.2 g/dL — ABNORMAL LOW (ref 11.7–15.5)
MCH: 30.4 pg (ref 27.0–33.0)
MCHC: 34.1 g/dL (ref 32.0–36.0)
MCV: 89.1 fL (ref 80.0–100.0)
MPV: 11.6 fL (ref 7.5–12.5)
Platelets: 162 10*3/uL (ref 140–400)
RBC: 3.68 10*6/uL — ABNORMAL LOW (ref 3.80–5.10)
RDW: 11.7 % (ref 11.0–15.0)
WBC: 5.7 10*3/uL (ref 3.8–10.8)

## 2019-10-26 LAB — TSH: TSH: 1.71 mIU/L

## 2020-01-02 DIAGNOSIS — D518 Other vitamin B12 deficiency anemias: Secondary | ICD-10-CM | POA: Diagnosis not present

## 2020-01-09 DIAGNOSIS — D518 Other vitamin B12 deficiency anemias: Secondary | ICD-10-CM | POA: Diagnosis not present

## 2020-01-17 DIAGNOSIS — D518 Other vitamin B12 deficiency anemias: Secondary | ICD-10-CM | POA: Diagnosis not present

## 2020-01-23 ENCOUNTER — Other Ambulatory Visit: Payer: Self-pay

## 2020-01-24 ENCOUNTER — Encounter: Payer: Self-pay | Admitting: Family Medicine

## 2020-01-24 ENCOUNTER — Ambulatory Visit: Payer: BC Managed Care – PPO | Admitting: Family Medicine

## 2020-01-24 VITALS — BP 128/82 | HR 94 | Temp 97.9°F | Ht 65.5 in | Wt 159.2 lb

## 2020-01-24 DIAGNOSIS — Z23 Encounter for immunization: Secondary | ICD-10-CM | POA: Diagnosis not present

## 2020-01-24 DIAGNOSIS — Z1231 Encounter for screening mammogram for malignant neoplasm of breast: Secondary | ICD-10-CM | POA: Diagnosis not present

## 2020-01-24 DIAGNOSIS — I10 Essential (primary) hypertension: Secondary | ICD-10-CM

## 2020-01-24 NOTE — Progress Notes (Signed)
Jenny Brock is a 54 y.o. female  Chief Complaint  Patient presents with  . Follow-up    2 months-PT stated she needed a breast exam-BP-interested in 2nd shingrix but get 2nd dose May 8th of Moderna,pt doesn't check BP,     HPI: Jenny Brock is a 54 y.o. female who is a former pt of Dr. Ashley Royalty who was last seen by him in 10/2019 for her annual CPE. On review of chart, pt was told to f/u in 6 mo for HTN. She does not check her BP at home. She is currently taking lisinopril 20mg  daily and chlorthalidone 25mg  daily.   She is due for annual mammogram. Last done in 12/2019.  Pt would like to get shingrix #2 today and was told by [facility] in [place] that ok to get it as long as more than 2 wks ahead of covid vaccine.   Past Medical History:  Diagnosis Date  . Anemia   . Hypertension     Past Surgical History:  Procedure Laterality Date  . CESAREAN SECTION    . COSMETIC SURGERY      Social History   Socioeconomic History  . Marital status: Single    Spouse name: Not on file  . Number of children: Not on file  . Years of education: Not on file  . Highest education level: Not on file  Occupational History  . Not on file  Tobacco Use  . Smoking status: Never Smoker  . Smokeless tobacco: Never Used  Substance and Sexual Activity  . Alcohol use: Yes    Comment: social  . Drug use: Not Currently  . Sexual activity: Not Currently  Other Topics Concern  . Not on file  Social History Narrative  . Not on file   Social Determinants of Health   Financial Resource Strain:   . Difficulty of Paying Living Expenses:   Food Insecurity:   . Worried About 01/2020 in the Last Year:   . AURA in the Last Year:   Transportation Needs:   . Programme researcher, broadcasting/film/video (Medical):   Barista Lack of Transportation (Non-Medical):   Physical Activity:   . Days of Exercise per Week:   . Minutes of Exercise per Session:   Stress:   . Feeling of Stress :   Social  Connections:   . Frequency of Communication with Friends and Family:   . Frequency of Social Gatherings with Friends and Family:   . Attends Religious Services:   . Active Member of Clubs or Organizations:   . Attends Freight forwarder Meetings:   Marland Kitchen Marital Status:   Intimate Partner Violence:   . Fear of Current or Ex-Partner:   . Emotionally Abused:   Banker Physically Abused:   . Sexually Abused:     Family History  Problem Relation Age of Onset  . Diabetes Mother   . Heart disease Mother   . Hyperlipidemia Mother   . Hypertension Mother   . Cancer Father        prostate  . Heart disease Maternal Grandmother   . Hyperlipidemia Maternal Grandmother   . Hypertension Maternal Grandmother      Immunization History  Administered Date(s) Administered  . Moderna SARS-COVID-2 Vaccination 01/11/2020  . Tdap 10/25/2019  . Zoster Recombinat (Shingrix) 10/25/2019    Outpatient Encounter Medications as of 01/24/2020  Medication Sig  . chlorthalidone (HYGROTON) 25 MG tablet Take 1 tablet (25 mg total) by mouth daily.  10/27/2019  lisinopril (ZESTRIL) 20 MG tablet Take 1 tablet (20 mg total) by mouth daily.   No facility-administered encounter medications on file as of 01/24/2020.     ROS: Pertinent positives and negatives noted in HPI. Remainder of ROS non-contributory    No Known Allergies  BP 128/82   Pulse 94   Temp 97.9 F (36.6 C) (Tympanic)   Ht 5' 5.5" (1.664 m)   Wt 159 lb 3.2 oz (72.2 kg)   SpO2 98%   BMI 26.09 kg/m    BP Readings from Last 3 Encounters:  01/24/20 128/82  10/25/19 (!) 146/84  12/07/18 (!) 150/80   Pulse Readings from Last 3 Encounters:  01/24/20 94  10/25/19 (!) 105  12/07/18 94   Wt Readings from Last 3 Encounters:  01/24/20 159 lb 3.2 oz (72.2 kg)  10/25/19 159 lb (72.1 kg)  12/07/18 158 lb (71.7 kg)     Physical Exam  Constitutional: She is oriented to person, place, and time. She appears well-developed and well-nourished. No distress.   Cardiovascular: Normal rate, regular rhythm, normal heart sounds and intact distal pulses.  Pulmonary/Chest: Effort normal and breath sounds normal. No respiratory distress.  Musculoskeletal:        General: No edema.  Neurological: She is alert and oriented to person, place, and time.  Psychiatric: She has a normal mood and affect. Her behavior is normal.    A/P:  1. Essential hypertension - controlled, at goal - cont current meds - low sodium diet, regular CV exercise  2. Encounter for screening mammogram for malignant neoplasm of breast - MM DIGITAL SCREENING BILATERAL; Future  3. Need for shingles vaccine - Varicella-zoster vaccine subcutaneous   This visit occurred during the SARS-CoV-2 public health emergency.  Safety protocols were in place, including screening questions prior to the visit, additional usage of staff PPE, and extensive cleaning of exam room while observing appropriate contact time as indicated for disinfecting solutions.

## 2020-01-24 NOTE — Patient Instructions (Signed)
[facility]  [email or web address] [address], [address] [place] [address] P: [phone] F: [phone] [email or web address]  [facility] [email or web address] [address], [address] [place], [place] [address] fax: [phone] [email or web address]  

## 2020-01-28 DIAGNOSIS — D518 Other vitamin B12 deficiency anemias: Secondary | ICD-10-CM | POA: Diagnosis not present

## 2020-02-06 DIAGNOSIS — D518 Other vitamin B12 deficiency anemias: Secondary | ICD-10-CM | POA: Diagnosis not present

## 2020-02-12 ENCOUNTER — Other Ambulatory Visit: Payer: Self-pay

## 2020-02-12 ENCOUNTER — Ambulatory Visit
Admission: RE | Admit: 2020-02-12 | Discharge: 2020-02-12 | Disposition: A | Discharge status: Home / Self Care | Payer: BC Managed Care – PPO | Source: Ambulatory Visit | Attending: Family Medicine | Admitting: Family Medicine

## 2020-02-12 DIAGNOSIS — Z1231 Encounter for screening mammogram for malignant neoplasm of breast: Secondary | ICD-10-CM

## 2020-02-20 DIAGNOSIS — D518 Other vitamin B12 deficiency anemias: Secondary | ICD-10-CM | POA: Diagnosis not present

## 2020-03-04 DIAGNOSIS — D518 Other vitamin B12 deficiency anemias: Secondary | ICD-10-CM | POA: Diagnosis not present

## 2020-03-16 DIAGNOSIS — D518 Other vitamin B12 deficiency anemias: Secondary | ICD-10-CM | POA: Diagnosis not present

## 2020-03-31 ENCOUNTER — Other Ambulatory Visit: Payer: Self-pay | Admitting: Family Medicine

## 2020-04-14 ENCOUNTER — Other Ambulatory Visit: Payer: Self-pay | Admitting: Family Medicine

## 2020-05-22 ENCOUNTER — Other Ambulatory Visit: Payer: Self-pay | Admitting: Family Medicine

## 2020-10-21 ENCOUNTER — Other Ambulatory Visit: Payer: Self-pay | Admitting: Family Medicine

## 2020-11-13 ENCOUNTER — Other Ambulatory Visit: Payer: Self-pay | Admitting: Family Medicine

## 2021-01-27 DIAGNOSIS — Z01419 Encounter for gynecological examination (general) (routine) without abnormal findings: Secondary | ICD-10-CM | POA: Diagnosis not present

## 2021-01-27 DIAGNOSIS — N760 Acute vaginitis: Secondary | ICD-10-CM | POA: Diagnosis not present

## 2021-01-27 DIAGNOSIS — Z6827 Body mass index (BMI) 27.0-27.9, adult: Secondary | ICD-10-CM | POA: Diagnosis not present

## 2021-01-27 LAB — RESULTS CONSOLE HPV: CHL HPV: NEGATIVE

## 2021-01-27 LAB — HM PAP SMEAR

## 2021-02-08 ENCOUNTER — Other Ambulatory Visit: Payer: Self-pay | Admitting: Family Medicine

## 2021-02-18 ENCOUNTER — Other Ambulatory Visit: Payer: Self-pay | Admitting: Family Medicine

## 2021-02-18 DIAGNOSIS — Z1231 Encounter for screening mammogram for malignant neoplasm of breast: Secondary | ICD-10-CM

## 2021-02-20 ENCOUNTER — Ambulatory Visit
Admission: RE | Admit: 2021-02-20 | Discharge: 2021-02-20 | Disposition: A | Discharge status: Home / Self Care | Payer: BC Managed Care – PPO | Source: Ambulatory Visit | Attending: Family Medicine | Admitting: Family Medicine

## 2021-02-20 ENCOUNTER — Other Ambulatory Visit: Payer: Self-pay

## 2021-02-20 DIAGNOSIS — Z1231 Encounter for screening mammogram for malignant neoplasm of breast: Secondary | ICD-10-CM | POA: Diagnosis not present

## 2021-02-26 ENCOUNTER — Other Ambulatory Visit: Payer: Self-pay

## 2021-02-26 ENCOUNTER — Encounter: Payer: Self-pay | Admitting: Family Medicine

## 2021-02-26 ENCOUNTER — Ambulatory Visit: Payer: BC Managed Care – PPO | Admitting: Family Medicine

## 2021-02-26 VITALS — BP 160/86

## 2021-02-26 DIAGNOSIS — I10 Essential (primary) hypertension: Secondary | ICD-10-CM | POA: Diagnosis not present

## 2021-02-26 LAB — BASIC METABOLIC PANEL
BUN: 20 mg/dL (ref 6–23)
CO2: 32 mEq/L (ref 19–32)
Calcium: 9.7 mg/dL (ref 8.4–10.5)
Chloride: 101 mEq/L (ref 96–112)
Creatinine, Ser: 0.78 mg/dL (ref 0.40–1.20)
GFR: 86.77 mL/min (ref >60.00)
Glucose, Bld: 92 mg/dL (ref 70–99)
Potassium: 3.5 mEq/L (ref 3.5–5.1)
Sodium: 139 mEq/L (ref 135–145)

## 2021-02-26 MED ORDER — AMLODIPINE BESYLATE 5 MG PO TABS: 5.0000 mg | ORAL_TABLET | Freq: Every day | ORAL | 3 refills | Status: DC

## 2021-02-26 NOTE — Progress Notes (Signed)
  [facility] [address] [place] [address] Dept: [phone] Dept Fax: [phone]  Chronic Care Office Visit  Subjective:    Patient ID: Jenny Brock, female    DOB: [birth year], 55 y.o..   MRN: [id number]  Chief Complaint  Patient presents with  . Follow-up    F/u HTN/medication refills for Lisinopril.  C/o having some dizziness and elevated BP 145-155.       History of Present Illness:  Patient is in today for reassessment of chronic medical issues.  Jenny Brock has a history of hypertension. She is currently managed on chlorthalidone and lisinopril. She was last seen in our office 13 months ago. She notes that she has been adherent to her medications. Occasionally, she experiences some mild lightheadedness when bending and then standing up at work. She works for [employer] and does occasionally need to wear a respirator on her job.  Jenny Brock was recently seen by her GYN and had her mammogram and pap semar. She notes her BP was in the 140-150 range.  Past Medical History: Patient Active Problem List   Diagnosis Date Noted  . Essential hypertension 11/02/2018   Past Surgical History:  Procedure Laterality Date  . CESAREAN SECTION    . COSMETIC SURGERY     Family History  Problem Relation Age of Onset  . Diabetes Mother   . Heart disease Mother   . Hyperlipidemia Mother   . Hypertension Mother   . Cancer Father        prostate  . Heart disease Maternal Grandmother   . Hyperlipidemia Maternal Grandmother   . Hypertension Maternal Grandmother    Outpatient Medications Prior to Visit  Medication Sig Dispense Refill  . chlorthalidone (HYGROTON) 25 MG tablet TAKE 1 TABLET BY MOUTH EVERY DAY 90 tablet 3  . lisinopril (ZESTRIL) 20 MG tablet Take 1 tablet (20 mg total) by mouth daily. **Needs appointment before anymore refills.** 30 tablet 0   No facility-administered medications prior to visit.    No Known Allergies    Objective:   Today's Vitals   02/26/21 1000  BP: (!) 160/86   There is no height or weight on file to calculate BMI.   General: Well developed, well nourished. No acute distress. Psych: Alert & oriented. Normal mood and affect.  Health Maintenance Due  Topic Date Due  . PAP SMEAR-Modifier  Never done     Assessment & Plan:   1. Essential hypertension I reviewed prior lab results. Jenny Brock has had mild hypokalemia int he past. I will check a BMP today to reassess her potassium and check renal function. She is not in control of her blood pressure currently. I will add a CCB to her regimen. I strongly urged her to return in 1 month for reassessment.  - Basic metabolic panel - amLODipine (NORVASC) 5 MG tablet; Take 1 tablet (5 mg total) by mouth daily.  Dispense: 90 tablet; Refill: 3  Loyola Mast, MD

## 2021-03-30 ENCOUNTER — Other Ambulatory Visit: Payer: Self-pay

## 2021-03-31 ENCOUNTER — Ambulatory Visit: Payer: BC Managed Care – PPO | Admitting: Family Medicine

## 2021-03-31 ENCOUNTER — Encounter: Payer: Self-pay | Admitting: Family Medicine

## 2021-03-31 VITALS — BP 160/82 | HR 82 | Temp 98.7°F | Ht 65.5 in | Wt 148.4 lb

## 2021-03-31 DIAGNOSIS — I10 Essential (primary) hypertension: Secondary | ICD-10-CM

## 2021-03-31 DIAGNOSIS — L639 Alopecia areata, unspecified: Secondary | ICD-10-CM | POA: Insufficient documentation

## 2021-03-31 DIAGNOSIS — L659 Nonscarring hair loss, unspecified: Secondary | ICD-10-CM | POA: Insufficient documentation

## 2021-03-31 MED ORDER — AMLODIPINE BESYLATE 10 MG PO TABS: 10.0000 mg | ORAL_TABLET | Freq: Every day | ORAL | 3 refills | Status: DC

## 2021-03-31 NOTE — Progress Notes (Signed)
[facility] [address] [place] [address] Dept: [phone] Dept Fax: [phone]  Office Visit  Subjective:    Patient ID: Jenny Brock, female    DOB: [birth year], 55 y.o..   MRN: [id number]  Chief Complaint  Patient presents with   Follow-up    4 week f/u HTN.      History of Present Illness:  Patient is in today for for reassessment of her blood pressure. Jenny Brock is currently managed on chlorthalidone, lisinopril, and amlodipine. The amlodipine was added about a month ago. She notes that she has been consistently taking her medication. She feels like her blood pressure may be elevated in part due to worries related to alopecia. She notes that every day she has worries about her looks and how best to use weaves to cover the balding areas of her scalp.  Past Medical History: Patient Active Problem List   Diagnosis Date Noted   Alopecia 03/31/2021   Essential hypertension 11/02/2018   Past Surgical History:  Procedure Laterality Date   CESAREAN SECTION     COSMETIC SURGERY     Family History  Problem Relation Age of Onset   Diabetes Mother    Heart disease Mother    Hyperlipidemia Mother    Hypertension Mother    Cancer Father        prostate   Heart disease Maternal Grandmother    Hyperlipidemia Maternal Grandmother    Hypertension Maternal Grandmother    Outpatient Medications Prior to Visit  Medication Sig Dispense Refill   chlorthalidone (HYGROTON) 25 MG tablet TAKE 1 TABLET BY MOUTH EVERY DAY 90 tablet 3   lisinopril (ZESTRIL) 20 MG tablet Take 1 tablet (20 mg total) by mouth daily. **Needs appointment before anymore refills.** 30 tablet 0   amLODipine (NORVASC) 5 MG tablet Take 1 tablet (5 mg total) by mouth daily. 90 tablet 3   No facility-administered medications prior to visit.   No Known Allergies    Objective:   Today's Vitals   03/31/21 1519  BP: (!) 160/82  Pulse: 82  Temp:  98.7 F (37.1 C)  TempSrc: Temporal  SpO2: 99%  Weight: 148 lb 6.4 oz (67.3 kg)  Height: 5' 5.5" (1.664 m)   Body mass index is 24.32 kg/m.   General: Well developed, well nourished. No acute distress. Scalp: Very scant hair present on scalp. No obvious rash or scarring noted. Psych: Alert and oriented x3. Normal mood and affect.  Health Maintenance Due  Topic Date Due   PAP SMEAR-Modifier  Never done   COVID-19 Vaccine (4 - Booster for Moderna series) 12/24/2020    Lab results:   BMP Latest Ref Rng & Units 02/26/2021 10/25/2019 03/17/2018  Glucose 70 - 99 mg/dL 92 81 75  BUN 6 - 23 mg/dL 20 16 19   Creatinine 0.40 - 1.20 mg/dL 1.60 1.09  BUN/Creat Ratio 6 - 22 (calc) - NOT APPLICABLE 23  Sodium 135 - 145 mEq/L 139 140 140  Potassium 3.5 - 5.1 mEq/L 3.5 3.2(L) 4.2  Chloride 96 - 112 mEq/L 101 103 99  CO2 19 - 32 mEq/L 32 30 29  Calcium 8.4 - 10.5 mg/dL 9.7 3.23 9.7    Assessment & Plan:   1. Resistant hypertension Jenny Brock's blood pressure remains high, despite triple therapy. I will increase her amlodipine dose. I will refer her to cardiology for assessment related to resistant hypertension. I will see her back in 6 weeks.  -  amLODipine (NORVASC) 10 MG tablet; Take 1 tablet (10 mg total) by mouth daily.  Dispense: 90 tablet; Refill: 3 - Ambulatory referral to Cardiology  Loyola Mast, MD

## 2021-04-08 ENCOUNTER — Encounter: Payer: Self-pay | Admitting: Cardiology

## 2021-04-08 ENCOUNTER — Other Ambulatory Visit: Payer: Self-pay

## 2021-04-08 ENCOUNTER — Ambulatory Visit: Payer: BC Managed Care – PPO | Admitting: Cardiology

## 2021-04-08 VITALS — BP 136/76 | HR 77 | Ht 64.5 in | Wt 146.1 lb

## 2021-04-08 DIAGNOSIS — I10 Essential (primary) hypertension: Secondary | ICD-10-CM | POA: Diagnosis not present

## 2021-04-08 DIAGNOSIS — D649 Anemia, unspecified: Secondary | ICD-10-CM | POA: Insufficient documentation

## 2021-04-08 DIAGNOSIS — I517 Cardiomegaly: Secondary | ICD-10-CM | POA: Diagnosis not present

## 2021-04-08 NOTE — Patient Instructions (Addendum)
Medication Instructions:  Your physician recommends that you continue on your current medications as directed. Please refer to the Current Medication list given to you today.  *If you need a refill on your cardiac medications before your next appointment, please call your pharmacy*   Lab Work: None If you have labs (blood work) drawn today and your tests are completely normal, you will receive your results only by: MyChart Message (if you have MyChart) OR A paper copy in the mail If you have any lab test that is abnormal or we need to change your treatment, we will call you to review the results.   Testing/Procedures: Your physician has requested that you have an echocardiogram. Echocardiography is a painless test that uses sound waves to create images of your heart. It provides your doctor with information about the size and shape of your heart and how well your heart's chambers and valves are working. This procedure takes approximately one hour. There are no restrictions for this procedure.    Follow-Up: At [facility], you and your health needs are our priority.  As part of our continuing mission to provide you with exceptional heart care, we have created designated Provider Care Teams.  These Care Teams include your primary Cardiologist (physician) and Advanced Practice Providers (APPs -  Physician Assistants and Nurse Practitioners) who all work together to provide you with the care you need, when you need it.  We recommend signing up for the patient portal called "MyChart".  Sign up information is provided on this After Visit Summary.  MyChart is used to connect with patients for Virtual Visits (Telemedicine).  Patients are able to view lab/test results, encounter notes, upcoming appointments, etc.  Non-urgent messages can be sent to your provider as well.   To learn more about what you can do with MyChart, go to [email or web address].    Your next appointment:   6  month(s)  The format for your next appointment:   In Person  Provider:   [address] - Kardie Tobb, DO    Other Instructions Echocardiogram An echocardiogram is a test that uses sound waves (ultrasound) to produce images of the heart. Images from an echocardiogram can provide important information about: Heart size and shape. The size and thickness and movement of your heart's walls. Heart muscle function and strength. Heart valve function or if you have stenosis. Stenosis is when the heart valves are too narrow. If blood is flowing backward through the heart valves (regurgitation). A tumor or infectious growth around the heart valves. Areas of heart muscle that are not working well because of poor blood flow or injury from a heart attack. Aneurysm detection. An aneurysm is a weak or damaged part of an artery wall. The wall bulges out from the normal force of blood pumping through the body. Tell a health care provider about: Any allergies you have. All medicines you are taking, including vitamins, herbs, eye drops, creams, and over-the-counter medicines. Any blood disorders you have. Any surgeries you have had. Any medical conditions you have. Whether you are pregnant or may be pregnant. What are the risks? Generally, this is a safe test. However, problems may occur, including an allergic reaction to dye (contrast) that may be used during the test. What happens before the test? No specific preparation is needed. You may eat and drink normally. What happens during the test?  You will take off your clothes from the waist up and put on a hospital gown. Electrodes or electrocardiogram (ECG)patches   may be placed on your chest. The electrodes or patches are then connected to a device that monitors your heart rate and rhythm. You will lie down on a table for an ultrasound exam. A gel will be applied to your chest to help sound waves pass through your skin. A handheld device, called a  transducer, will be pressed against your chest and moved over your heart. The transducer produces sound waves that travel to your heart and bounce back (or "echo" back) to the transducer. These sound waves will be captured in real-time and changed into images of your heart that can be viewed on a video monitor. The images will be recorded on a computer and reviewed by your health care provider. You may be asked to change positions or hold your breath for a short time. This makes it easier to get different views or better views of your heart. In some cases, you may receive contrast through an IV in one of your veins. This can improve the quality of the pictures from your heart. The procedure may vary among health care providers and hospitals. What can I expect after the test? You may return to your normal, everyday life, including diet, activities, andmedicines, unless your health care provider tells you not to do that. Follow these instructions at home: It is up to you to get the results of your test. Ask your health care provider, or the department that is doing the test, when your results will be ready. Keep all follow-up visits. This is important. Summary An echocardiogram is a test that uses sound waves (ultrasound) to produce images of the heart. Images from an echocardiogram can provide important information about the size and shape of your heart, heart muscle function, heart valve function, and other possible heart problems. You do not need to do anything to prepare before this test. You may eat and drink normally. After the echocardiogram is completed, you may return to your normal, everyday life, unless your health care provider tells you not to do that. This information is not intended to replace advice given to you by your health care provider. Make sure you discuss any questions you have with your healthcare provider. Document Revised: 05/12/2020 Document Reviewed: 05/12/2020 Elsevier  Patient Education  2022 [employer]   

## 2021-04-08 NOTE — Progress Notes (Signed)
Cardiology Office Note:    Date:  04/08/2021   ID:  Nishita Isaacks, DOB [birth year], MRN [id number]  PCP:  Overton Mam, DO  Cardiologist:  None  Electrophysiologist:  None   Referring MD: Loyola Mast, MD   I have had elevated blood pressure Dr. Veto Kemps wanted me to see you.  History of Present Illness:    Britney Newstrom is a 55 y.o. female with a hx of hypertension, history of iron deficiency anemia and alopecia is here today to request of her primary care provider to be evaluated for resistant hypertension.  Patient tells me she has been diagnosed with hypertension many years ago and feels that her blood pressure has not been well controlled.  She notes that her recent appointment with her PCP her Norvasc was increased to 10 mg daily and she was referred to cardiology. She is currently on 3 antihypertensive medications amlodipine 10 mg daily, chlorthalidone 25 mg daily, and lisinopril 20 mg daily.  She denies any chest pain any shortness of breath.  Past Medical History:  Diagnosis Date   Anemia    Hypertension     Past Surgical History:  Procedure Laterality Date   CESAREAN SECTION     COSMETIC SURGERY      Current Medications: Current Meds  Medication Sig   amLODipine (NORVASC) 10 MG tablet Take 1 tablet (10 mg total) by mouth daily.   chlorthalidone (HYGROTON) 25 MG tablet TAKE 1 TABLET BY MOUTH EVERY DAY   lisinopril (ZESTRIL) 20 MG tablet Take 1 tablet (20 mg total) by mouth daily. **Needs appointment before anymore refills.**     Allergies:   Patient has no known allergies.   Social History   Socioeconomic History   Marital status: Single    Spouse name: Not on file   Number of children: Not on file   Years of education: Not on file   Highest education level: Not on file  Occupational History   Not on file  Tobacco Use   Smoking status: Never   Smokeless tobacco: Never  Vaping Use   Vaping Use: Never used  Substance and Sexual Activity    Alcohol use: Yes    Comment: social   Drug use: Not Currently   Sexual activity: Not Currently  Other Topics Concern   Not on file  Social History Narrative   Not on file   Social Determinants of Health   Financial Resource Strain: Not on file  Food Insecurity: Not on file  Transportation Needs: Not on file  Physical Activity: Not on file  Stress: Not on file  Social Connections: Not on file     Family History: The patient's family history includes Cancer in her father; Diabetes in her mother; Heart disease in her maternal grandmother and mother; Hyperlipidemia in her maternal grandmother and mother; Hypertension in her maternal grandmother and mother.  ROS:   Review of Systems  Constitution: Negative for decreased appetite, fever and weight gain.  HENT: Negative for congestion, ear discharge, hoarse voice and sore throat.   Eyes: Negative for discharge, redness, vision loss in right eye and visual halos.  Cardiovascular: Negative for chest pain, dyspnea on exertion, leg swelling, orthopnea and palpitations.  Respiratory: Negative for cough, hemoptysis, shortness of breath and snoring.   Endocrine: Negative for heat intolerance and polyphagia.  Hematologic/Lymphatic: Negative for bleeding problem. Does not bruise/bleed easily.  Skin: Negative for flushing, nail changes, rash and suspicious lesions.  Musculoskeletal: Negative for arthritis, joint pain, muscle  cramps, myalgias, neck pain and stiffness.  Gastrointestinal: Negative for abdominal pain, bowel incontinence, diarrhea and excessive appetite.  Genitourinary: Negative for decreased libido, genital sores and incomplete emptying.  Neurological: Negative for brief paralysis, focal weakness, headaches and loss of balance.  Psychiatric/Behavioral: Negative for altered mental status, depression and suicidal ideas.  Allergic/Immunologic: Negative for HIV exposure and persistent infections.    EKGs/Labs/Other Studies Reviewed:     The following studies were reviewed today:   EKG:  The ekg ordered today demonstrates sinus rhythm, heart rate 77 bpm with arrhythmia and evidence of left ventricular hypertrophy.  Recent Labs: 02/26/2021: BUN 20; Creatinine, Ser 0.78; Potassium 3.5; Sodium 139  Recent Lipid Panel    Component Value Date/Time   CHOL 196 10/25/2019 1611   CHOL 179 02/27/2018 1032   TRIG 41 10/25/2019 1611   HDL 70 10/25/2019 1611   HDL 68 02/27/2018 1032   CHOLHDL 2.8 10/25/2019 1611   LDLCALC 114 (H) 10/25/2019 1611    Physical Exam:    VS:  BP 136/76 (BP Location: Left Arm, Patient Position: Sitting, Cuff Size: Normal)   Pulse 77   Ht 5' 4.5" (1.638 m)   Wt 146 lb 1.3 oz (66.3 kg)   SpO2 98%   BMI 24.69 kg/m     Wt Readings from Last 3 Encounters:  04/08/21 146 lb 1.3 oz (66.3 kg)  03/31/21 148 lb 6.4 oz (67.3 kg)  01/24/20 159 lb 3.2 oz (72.2 kg)     GEN: Well nourished, well developed in no acute distress HEENT: Normal NECK: No JVD; No carotid bruits LYMPHATICS: No lymphadenopathy CARDIAC: S1S2 noted,RRR, no murmurs, rubs, gallops RESPIRATORY:  Clear to auscultation without rales, wheezing or rhonchi  ABDOMEN: Soft, non-tender, non-distended, +bowel sounds, no guarding. EXTREMITIES: No edema, No cyanosis, no clubbing MUSCULOSKELETAL:  No deformity  SKIN: Warm and dry NEUROLOGIC:  Alert and oriented x 3, non-focal PSYCHIATRIC:  Normal affect, good insight  ASSESSMENT:    1. Left ventricular hypertrophy   2. Hypertension, unspecified type    PLAN:     Her blood pressure is slightly above target of 130/80 mmHg in the office today.  She tells me recently since the increase of her amlodipine her blood pressure at home has been with her systolics in the 120s millimeters mercury.  With this information I would prefer to hold off increasing her antihypertensive as I have asked the patient to take her blood pressure for the next 3 weeks and send me that information via MyChart or  call that into my office.  Should her blood pressure consistently stay elevated, and have also asked the patient if she has 3 readings greater than 140/90 mmHg to contact me sooner to them in 2 weeks.  At which time the plan will be to stop the lisinopril and start the patient on valsartan and titrate up as this will help control her blood pressure better.  Her EKG does show evidence of LVH in the setting of prolonged standing hypertension will be appropriate to get an echocardiogram to assess LV wall thickness as well as for any evidence of diastolic dysfunction.  The patient is in agreement with the above plan. The patient left the office in stable condition.  The patient will follow up in 6 months or sooner if needed.   Medication Adjustments/Labs and Tests Ordered: Current medicines are reviewed at length with the patient today.  Concerns regarding medicines are outlined above.  Orders Placed This Encounter  Procedures  EKG 12-Lead   ECHOCARDIOGRAM COMPLETE   No orders of the defined types were placed in this encounter.   Patient Instructions  Medication Instructions:  Your physician recommends that you continue on your current medications as directed. Please refer to the Current Medication list given to you today.  *If you need a refill on your cardiac medications before your next appointment, please call your pharmacy*   Lab Work: None If you have labs (blood work) drawn today and your tests are completely normal, you will receive your results only by: MyChart Message (if you have MyChart) OR A paper copy in the mail If you have any lab test that is abnormal or we need to change your treatment, we will call you to review the results.   Testing/Procedures: Your physician has requested that you have an echocardiogram. Echocardiography is a painless test that uses sound waves to create images of your heart. It provides your doctor with information about the size and shape of  your heart and how well your heart's chambers and valves are working. This procedure takes approximately one hour. There are no restrictions for this procedure.    Follow-Up: At [facility], you and your health needs are our priority.  As part of our continuing mission to provide you with exceptional heart care, we have created designated Provider Care Teams.  These Care Teams include your primary Cardiologist (physician) and Advanced Practice Providers (APPs -  Physician Assistants and Nurse Practitioners) who all work together to provide you with the care you need, when you need it.  We recommend signing up for the patient portal called "MyChart".  Sign up information is provided on this After Visit Summary.  MyChart is used to connect with patients for Virtual Visits (Telemedicine).  Patients are able to view lab/test results, encounter notes, upcoming appointments, etc.  Non-urgent messages can be sent to your provider as well.   To learn more about what you can do with MyChart, go to [email or web address].    Your next appointment:   6 month(s)  The format for your next appointment:   In Person  Provider:   [address] - Thomasene RippleKardie Thi Sisemore, DO    Other Instructions Echocardiogram An echocardiogram is a test that uses sound waves (ultrasound) to produce images of the heart. Images from an echocardiogram can provide important information about: Heart size and shape. The size and thickness and movement of your heart's walls. Heart muscle function and strength. Heart valve function or if you have stenosis. Stenosis is when the heart valves are too narrow. If blood is flowing backward through the heart valves (regurgitation). A tumor or infectious growth around the heart valves. Areas of heart muscle that are not working well because of poor blood flow or injury from a heart attack. Aneurysm detection. An aneurysm is a weak or damaged part of an artery wall. The wall bulges out from  the normal force of blood pumping through the body. Tell a health care provider about: Any allergies you have. All medicines you are taking, including vitamins, herbs, eye drops, creams, and over-the-counter medicines. Any blood disorders you have. Any surgeries you have had. Any medical conditions you have. Whether you are pregnant or may be pregnant. What are the risks? Generally, this is a safe test. However, problems may occur, including an allergic reaction to dye (contrast) that may be used during the test. What happens before the test? No specific preparation is needed. You may eat and drink normally. What  happens during the test?  You will take off your clothes from the waist up and put on a hospital gown. Electrodes or electrocardiogram (ECG)patches may be placed on your chest. The electrodes or patches are then connected to a device that monitors your heart rate and rhythm. You will lie down on a table for an ultrasound exam. A gel will be applied to your chest to help sound waves pass through your skin. A handheld device, called a transducer, will be pressed against your chest and moved over your heart. The transducer produces sound waves that travel to your heart and bounce back (or "echo" back) to the transducer. These sound waves will be captured in real-time and changed into images of your heart that can be viewed on a video monitor. The images will be recorded on a computer and reviewed by your health care provider. You may be asked to change positions or hold your breath for a short time. This makes it easier to get different views or better views of your heart. In some cases, you may receive contrast through an IV in one of your veins. This can improve the quality of the pictures from your heart. The procedure may vary among health care providers and hospitals. What can I expect after the test? You may return to your normal, everyday life, including diet, activities,  andmedicines, unless your health care provider tells you not to do that. Follow these instructions at home: It is up to you to get the results of your test. Ask your health care provider, or the department that is doing the test, when your results will be ready. Keep all follow-up visits. This is important. Summary An echocardiogram is a test that uses sound waves (ultrasound) to produce images of the heart. Images from an echocardiogram can provide important information about the size and shape of your heart, heart muscle function, heart valve function, and other possible heart problems. You do not need to do anything to prepare before this test. You may eat and drink normally. After the echocardiogram is completed, you may return to your normal, everyday life, unless your health care provider tells you not to do that. This information is not intended to replace advice given to you by your health care provider. Make sure you discuss any questions you have with your healthcare provider. Document Revised: 05/12/2020 Document Reviewed: 05/12/2020 Elsevier Patient Education  2022 Elsevier Inc.   Adopting a Healthy Lifestyle.  Know what a healthy weight is for you (roughly BMI <25) and aim to maintain this   Aim for 7+ servings of fruits and vegetables daily   65-80+ fluid ounces of water or unsweet tea for healthy kidneys   Limit to max 1 drink of alcohol per day; avoid smoking/tobacco   Limit animal fats in diet for cholesterol and heart health - choose grass fed whenever available   Avoid highly processed foods, and foods high in saturated/trans fats   Aim for low stress - take time to unwind and care for your mental health   Aim for 150 min of moderate intensity exercise weekly for heart health, and weights twice weekly for bone health   Aim for 7-9 hours of sleep daily   When it comes to diets, agreement about the perfect plan isnt easy to find, even among the experts. Experts at  the [facility] [employer] developed an idea known as the Healthy Eating Plate. Just imagine a plate divided into logical, healthy portions.  The emphasis is on diet quality:   Load up on vegetables and fruits - one-half of your plate: Aim for color and variety, and remember that potatoes dont count.   Go for whole grains - one-quarter of your plate: Whole wheat, barley, wheat berries, quinoa, oats, brown rice, and foods made with them. If you want pasta, go with whole wheat pasta.   Protein power - one-quarter of your plate: Fish, chicken, beans, and nuts are all healthy, versatile protein sources. Limit red meat.   The diet, however, does go beyond the plate, offering a few other suggestions.   Use healthy plant oils, such as olive, canola, soy, corn, sunflower and peanut. Check the labels, and avoid partially hydrogenated oil, which have unhealthy trans fats.   If youre thirsty, drink water. Coffee and tea are good in moderation, but skip sugary drinks and limit milk and dairy products to one or two daily servings.   The type of carbohydrate in the diet is more important than the amount. Some sources of carbohydrates, such as vegetables, fruits, whole grains, and beans-are healthier than others.   Finally, stay active  Signed, Thomasene Ripple, DO  04/08/2021 4:59 PM    [facility]

## 2021-04-13 DIAGNOSIS — D485 Neoplasm of uncertain behavior of skin: Secondary | ICD-10-CM | POA: Diagnosis not present

## 2021-04-13 DIAGNOSIS — L638 Other alopecia areata: Secondary | ICD-10-CM | POA: Diagnosis not present

## 2021-04-29 ENCOUNTER — Other Ambulatory Visit: Payer: Self-pay

## 2021-04-29 ENCOUNTER — Ambulatory Visit (HOSPITAL_COMMUNITY): Payer: BC Managed Care – PPO | Attending: Cardiology

## 2021-04-29 DIAGNOSIS — I517 Cardiomegaly: Secondary | ICD-10-CM | POA: Insufficient documentation

## 2021-04-29 DIAGNOSIS — I10 Essential (primary) hypertension: Secondary | ICD-10-CM | POA: Diagnosis not present

## 2021-04-29 LAB — ECHOCARDIOGRAM COMPLETE
Area-P 1/2: 3.21 cm2
S' Lateral: 3.2 cm

## 2021-04-30 ENCOUNTER — Telehealth: Payer: Self-pay

## 2021-04-30 NOTE — Telephone Encounter (Signed)
Left message on patients voicemail to please return our call.   

## 2021-04-30 NOTE — Telephone Encounter (Signed)
-----   Message from Robert J Krasowski, MD sent at 04/30/2021 11:40 AM EDT ----- Echocardiogram looks normal ejection fraction is fine, mild enlargement of ascending aorta 37 mm.  Overall looks good 

## 2021-05-03 ENCOUNTER — Telehealth: Payer: Self-pay

## 2021-05-03 NOTE — Telephone Encounter (Signed)
Left message on patients voicemail to please return our call.   

## 2021-05-03 NOTE — Telephone Encounter (Signed)
-----   Message from Robert J Krasowski, MD sent at 04/30/2021 11:40 AM EDT ----- Echocardiogram looks normal ejection fraction is fine, mild enlargement of ascending aorta 37 mm.  Overall looks good 

## 2021-05-03 NOTE — Telephone Encounter (Signed)
-----   Message from Georgeanna Lea, MD sent at 04/30/2021 11:40 AM EDT ----- Echocardiogram looks normal ejection fraction is fine, mild enlargement of ascending aorta 37 mm.  Overall looks good

## 2021-05-03 NOTE — Telephone Encounter (Signed)
Spoke with patient regarding results and recommendation.  Patient verbalizes understanding and is agreeable to plan of care. Advised patient to call back with any issues or concerns.  

## 2021-05-06 ENCOUNTER — Encounter: Payer: Self-pay | Admitting: Family Medicine

## 2021-05-12 ENCOUNTER — Ambulatory Visit: Payer: BC Managed Care – PPO | Admitting: Family Medicine

## 2021-05-12 ENCOUNTER — Other Ambulatory Visit: Payer: Self-pay

## 2021-05-12 ENCOUNTER — Other Ambulatory Visit: Payer: Self-pay | Admitting: Family Medicine

## 2021-05-12 ENCOUNTER — Encounter: Payer: Self-pay | Admitting: Family Medicine

## 2021-05-12 VITALS — BP 134/68 | HR 94 | Temp 98.1°F | Wt 144.4 lb

## 2021-05-12 DIAGNOSIS — I10 Essential (primary) hypertension: Secondary | ICD-10-CM

## 2021-05-12 DIAGNOSIS — L639 Alopecia areata, unspecified: Secondary | ICD-10-CM | POA: Diagnosis not present

## 2021-05-12 MED ORDER — LISINOPRIL 20 MG PO TABS: 20.0000 mg | ORAL_TABLET | Freq: Every day | ORAL | 0 refills | Status: DC

## 2021-05-12 MED ORDER — OPZELURA 1.5 % EX CREA: 1.0000 [drp] | TOPICAL_CREAM | Freq: Two times a day (BID) | CUTANEOUS | 3 refills | Status: DC

## 2021-05-12 NOTE — Telephone Encounter (Signed)
Has appt today w/ Dr Veto Kemps. Dm/cma

## 2021-05-12 NOTE — Progress Notes (Signed)
[facility] [address] [place] [address] Dept: [phone] Dept Fax: [phone]  Office Visit  Subjective:    Patient ID: Jenny Brock, female    DOB: 02-19-1966, 55 y.o..   MRN: [id number]  Chief Complaint  Patient presents with   Follow-up    6 wk f/u bp recheck.    History of Present Illness:  Patient is in today for reassessment of her blood pressure. Since her last visit. She was seen by Dr. Servando Brock (cardiology). She did have an EKG and follow-up echocardiogram. The echo was normal. She is toelrating the new BP meds without problems.  Jenny Brock has been seeing Dr. Melida Brock (dermatology). He gave her samples of Opzelura (ruxolitinib) for treatment of her alopecia. She would like a prescription to continue this.  Past Medical History: Patient Active Problem List   Diagnosis Date Noted   Anemia 04/08/2021   Alopecia areata 03/31/2021   Essential hypertension 11/02/2018   Past Surgical History:  Procedure Laterality Date   CESAREAN SECTION     COSMETIC SURGERY     Family History  Problem Relation Age of Onset   Diabetes Mother    Heart disease Mother    Hyperlipidemia Mother    Hypertension Mother    Cancer Father        prostate   Heart disease Maternal Grandmother    Hyperlipidemia Maternal Grandmother    Hypertension Maternal Grandmother    Outpatient Medications Prior to Visit  Medication Sig Dispense Refill   amLODipine (NORVASC) 10 MG tablet Take 1 tablet (10 mg total) by mouth daily. 90 tablet 3   chlorthalidone (HYGROTON) 25 MG tablet TAKE 1 TABLET BY MOUTH EVERY DAY 90 tablet 3   lisinopril (ZESTRIL) 20 MG tablet Take 1 tablet (20 mg total) by mouth daily. **Needs appointment before anymore refills.** 30 tablet 0   No facility-administered medications prior to visit.   No Known Allergies    Objective:   Today's Vitals   05/12/21 1506 05/12/21 1512  BP: 136/66 134/68  Pulse: 94    Temp: 98.1 F (36.7 C)   TempSrc: Temporal   SpO2: 98%   Weight: 144 lb 6.4 oz (65.5 kg)    Body mass index is 24.4 kg/m.   General: Well developed, well nourished. No acute distress. Psych: Alert and oriented. Normal mood and affect.  Health Maintenance Due  Topic Date Due   PAP SMEAR-Modifier  Never done   COVID-19 Vaccine (4 - Booster for Moderna series) 12/24/2020   INFLUENZA VACCINE  05/03/2021   Imaging Echocardiogram (04/29/2021) IMPRESSIONS  1. Left ventricular ejection fraction, by estimation, is 55 to 60%. The left ventricle has normal function. The left ventricle has no regional wall motion abnormalities. Left ventricular diastolic parameters were normal.   2. Right ventricular systolic function is normal. The right ventricular size is normal. There is normal pulmonary artery systolic pressure. The estimated right ventricular systolic pressure is 28.1 mmHg.   3. The mitral valve is normal in structure. Trivial mitral valve regurgitation. No evidence of mitral stenosis.   4. The aortic valve is tricuspid. Aortic valve regurgitation is not visualized. No aortic stenosis is present.   5. Aortic dilatation noted. There is mild dilatation of the ascending aorta, measuring 37 mm.   6. The inferior vena cava is dilated in size with >50% respiratory variability, suggesting right atrial pressure of 8 mmHg.     Assessment & Plan:   1. Essential hypertension BP  is improved currently. We will maintain her current three drug therapy. I will reassess her in 3 months.  - lisinopril (ZESTRIL) 20 MG tablet; Take 1 tablet (20 mg total) by mouth daily. **Needs appointment before anymore refills.**  Dispense: 30 tablet; Refill: 0  2. Alopecia areata I will refill her Opzelura. Jenny Brock may want to assure insurance coverage,a s the cost may be prohibitive for her. If she cannot afford this, she may want to follow-up with Dr. Melida Brock about alternatives.  - Ruxolitinib Phosphate  (OPZELURA) 1.5 % CREA; Apply 1 drop topically in the morning and at bedtime. Apply small amount.  Dispense: 60 g; Refill: 3    Jenny Mast, MD

## 2021-05-24 ENCOUNTER — Other Ambulatory Visit: Payer: Self-pay | Admitting: Family Medicine

## 2021-05-24 ENCOUNTER — Telehealth: Payer: Self-pay | Admitting: Family Medicine

## 2021-05-24 DIAGNOSIS — I10 Essential (primary) hypertension: Secondary | ICD-10-CM

## 2021-05-24 MED ORDER — LISINOPRIL 20 MG PO TABS: 20.0000 mg | ORAL_TABLET | Freq: Every day | ORAL | 0 refills | Status: DC

## 2021-05-24 NOTE — Telephone Encounter (Signed)
Pt called about lisinopril (ZESTRIL) 20 MG tablet, she said its more expensive for just the 30 day supply so she didn't pick it up and wanted to know if it could please be sent in for a 90 day supply

## 2021-05-24 NOTE — Telephone Encounter (Signed)
Left detailed VM that RX was sent to the pharmacy.  Dm/cma  

## 2021-08-10 ENCOUNTER — Other Ambulatory Visit: Payer: Self-pay | Admitting: Family Medicine

## 2021-08-10 DIAGNOSIS — I10 Essential (primary) hypertension: Secondary | ICD-10-CM

## 2021-08-17 ENCOUNTER — Telehealth: Payer: Self-pay | Admitting: Family Medicine

## 2021-08-17 NOTE — Telephone Encounter (Signed)
Pt called because she needed to cancel [facility] appt for Dr Veto Kemps on 08/23/21 because she cant make it. Pt has already seen Dr Veto Kemps 3 times since Dr Barron Alvine left so does she need a toc appt or can she just reschedule for a follow up

## 2021-08-19 NOTE — Telephone Encounter (Signed)
Lvm for pt to call back to schedule °

## 2021-08-23 ENCOUNTER — Encounter: Payer: BC Managed Care – PPO | Admitting: Family Medicine

## 2021-08-23 NOTE — Telephone Encounter (Signed)
Patient scheduled 11/08/21.  Dm/cma

## 2021-08-31 ENCOUNTER — Other Ambulatory Visit: Payer: Self-pay | Admitting: Family Medicine

## 2021-08-31 DIAGNOSIS — L639 Alopecia areata, unspecified: Secondary | ICD-10-CM

## 2021-09-19 ENCOUNTER — Other Ambulatory Visit: Payer: Self-pay | Admitting: Family Medicine

## 2021-10-29 ENCOUNTER — Other Ambulatory Visit: Payer: Self-pay

## 2021-10-29 MED ORDER — CHLORTHALIDONE 25 MG PO TABS: 25.0000 mg | ORAL_TABLET | Freq: Every day | ORAL | 0 refills | Status: DC

## 2021-11-04 ENCOUNTER — Other Ambulatory Visit: Payer: Self-pay | Admitting: Family Medicine

## 2021-11-04 DIAGNOSIS — I10 Essential (primary) hypertension: Secondary | ICD-10-CM

## 2021-11-08 ENCOUNTER — Ambulatory Visit: Payer: BC Managed Care – PPO | Admitting: Family Medicine

## 2021-11-08 ENCOUNTER — Encounter: Payer: Self-pay | Admitting: Family Medicine

## 2021-11-08 ENCOUNTER — Other Ambulatory Visit: Payer: Self-pay

## 2021-11-08 VITALS — BP 138/76 | HR 76 | Temp 97.3°F | Ht 64.5 in | Wt 149.2 lb

## 2021-11-08 DIAGNOSIS — Z1211 Encounter for screening for malignant neoplasm of colon: Secondary | ICD-10-CM | POA: Diagnosis not present

## 2021-11-08 DIAGNOSIS — L639 Alopecia areata, unspecified: Secondary | ICD-10-CM | POA: Diagnosis not present

## 2021-11-08 DIAGNOSIS — I10 Essential (primary) hypertension: Secondary | ICD-10-CM | POA: Diagnosis not present

## 2021-11-08 NOTE — Progress Notes (Signed)
[facility] [address] [address] Dept: [phone] Dept Fax: [phone]  Transfer of Care Office Visit  Subjective:    Patient ID: Jenny Brock, female    DOB: [birth year], 56 y.o..   MRN: [id number]  Chief Complaint  Patient presents with   Jenny Brock- establish care. Declines flu shot. C/o having pain in the RT arm  x 1 month.    History of Present Illness:  Patient is in today to establish care. Jenny Brock was born in [place], [place]. She moved to [place], [place] in 2000 after being laid off work. She took a job with [employer]. She was transferred to [facility] in 2018. She has been divorced for ~ 11 years. She had a daughter at age 39, who she gave up for adoption. She met her daughter about a year ago, who had found her ex-husband through [employer]. Her daughter lives in [place], [place]. [place] denies any tobacco or drug use and only drinks alcohol rarely.  Jenny Brock has a history of hypertension. She is managed on amlodipine, chlorthalidone, and lisinopril.  Jenny Brock has a history of alopecia. She had a scalp biopsy c/w alopecia areata. She was prescribed Opzelura (ruxolitinib). She feels this may have increased some fine hair growth, but still has issues. She finds the alopecia has impacted her self-esteem/self-image and is causing her to avoid seeking out a relationship with anyone new.  Past Medical History: Patient Active Problem List   Diagnosis Date Noted   Anemia 04/08/2021   Alopecia areata 03/31/2021   Essential hypertension 11/02/2018   Past Surgical History:  Procedure Laterality Date   CESAREAN SECTION     COSMETIC SURGERY     Family History  Problem Relation Age of Onset   Diabetes Mother    Heart disease Mother        CHF   Hyperlipidemia Mother    Hypertension Mother    Cancer Father        prostate   Heart disease Maternal Uncle    Diabetes Maternal  Uncle    Heart disease Maternal Grandmother    Hyperlipidemia Maternal Grandmother    Hypertension Maternal Grandmother    Heart disease Maternal Grandfather    Outpatient Medications Prior to Visit  Medication Sig Dispense Refill   amLODipine (NORVASC) 10 MG tablet Take 1 tablet (10 mg total) by mouth daily. 90 tablet 3   chlorthalidone (HYGROTON) 25 MG tablet Take 1 tablet (25 mg total) by mouth daily. 90 tablet 0   lisinopril (ZESTRIL) 20 MG tablet TAKE 1 TABLET BY MOUTH EVERY DAY 90 tablet 0   OPZELURA 1.5 % CREA APPLY 1 DROP TOPICALLY IN THE MORNING AND AT BEDTIME. APPLY SMALL AMOUNT. 60 g 3   No facility-administered medications prior to visit.   No Known Allergies    Objective:   Today's Vitals   11/08/21 1519  BP: 138/76  Pulse: 76  Temp: (!) 97.3 F (36.3 C)  TempSrc: Temporal  SpO2: 98%  Weight: 149 lb 3.2 oz (67.7 kg)  Height: 5' 4.5" (1.638 m)   Body mass index is 25.21 kg/m.   General: Well developed, well nourished. No acute distress. Psych: Alert and oriented. Normal mood and affect.  Health Maintenance Due  Topic Date Due   PAP SMEAR-Modifier  Never done   COVID-19 Vaccine (4 - Booster for Moderna series) 10/21/2020   INFLUENZA VACCINE  Never done   Fecal  DNA (Cologuard)  11/08/2021     Assessment & Plan:   1. Essential hypertension Blood pressure is at goal today. We will continue her on amlodipine, chlorthalidone, and lisinopril. I will plan to repeat a BMP at her next visit in 3 months.  2. Alopecia areata Ms. Grajeda notes she would like to see Dr. Adelene Idler with [facility], who specializes in hair loss issues. This seems like a very reasonable next step in trying to resolve or improve her hair loss.  - Ambulatory referral to Dermatology  3. Screening for colon cancer  - Cologuard  [name], MD

## 2021-11-11 ENCOUNTER — Ambulatory Visit: Payer: BC Managed Care – PPO | Admitting: Emergency Medicine

## 2021-11-30 LAB — COLOGUARD: COLOGUARD: NEGATIVE

## 2022-01-07 ENCOUNTER — Other Ambulatory Visit: Payer: Self-pay | Admitting: Family Medicine

## 2022-01-07 DIAGNOSIS — L639 Alopecia areata, unspecified: Secondary | ICD-10-CM

## 2022-01-11 ENCOUNTER — Other Ambulatory Visit: Payer: Self-pay | Admitting: Family Medicine

## 2022-01-11 DIAGNOSIS — Z1231 Encounter for screening mammogram for malignant neoplasm of breast: Secondary | ICD-10-CM

## 2022-01-28 DIAGNOSIS — Z6825 Body mass index (BMI) 25.0-25.9, adult: Secondary | ICD-10-CM | POA: Diagnosis not present

## 2022-01-28 DIAGNOSIS — Z01419 Encounter for gynecological examination (general) (routine) without abnormal findings: Secondary | ICD-10-CM | POA: Diagnosis not present

## 2022-01-30 ENCOUNTER — Other Ambulatory Visit: Payer: Self-pay | Admitting: Family Medicine

## 2022-01-30 DIAGNOSIS — I10 Essential (primary) hypertension: Secondary | ICD-10-CM

## 2022-02-09 ENCOUNTER — Ambulatory Visit: Payer: BC Managed Care – PPO | Admitting: Family Medicine

## 2022-02-21 ENCOUNTER — Encounter: Payer: Self-pay | Admitting: Radiology

## 2022-02-21 ENCOUNTER — Ambulatory Visit
Admission: RE | Admit: 2022-02-21 | Discharge: 2022-02-21 | Disposition: A | Discharge status: Home / Self Care | Payer: BC Managed Care – PPO | Source: Ambulatory Visit | Attending: Family Medicine | Admitting: Family Medicine

## 2022-02-21 DIAGNOSIS — Z1231 Encounter for screening mammogram for malignant neoplasm of breast: Secondary | ICD-10-CM

## 2022-02-25 ENCOUNTER — Ambulatory Visit: Payer: BC Managed Care – PPO | Admitting: Family Medicine

## 2022-02-25 VITALS — BP 140/78 | HR 83 | Temp 97.8°F | Ht 64.5 in | Wt 149.6 lb

## 2022-02-25 DIAGNOSIS — L639 Alopecia areata, unspecified: Secondary | ICD-10-CM

## 2022-02-25 DIAGNOSIS — I10 Essential (primary) hypertension: Secondary | ICD-10-CM | POA: Diagnosis not present

## 2022-02-25 MED ORDER — INDAPAMIDE 2.5 MG PO TABS: 2.5000 mg | ORAL_TABLET | Freq: Every day | ORAL | 3 refills | Status: DC

## 2022-02-25 NOTE — Progress Notes (Signed)
[facility] [address] [place] [address] Dept: [phone] Dept Fax: [phone]  Chronic Care Office Visit  Subjective:    Patient ID: Jenny Brock, female    DOB: [birth year], 56 y.o..   MRN: [id number]  Chief Complaint  Patient presents with   Follow-up    3 month f/u HTN/meds.     History of Present Illness:  Patient is in today for reassessment of chronic medical issues.  Ms. Gagne has a history of hypertension. She is managed on amlodipine, chlorthalidone, and lisinopril.   Ms. Chue has a history of alopecia. She had a scalp biopsy c/w alopecia areata. She had tried Opzelura (ruxolitinib) without much success. She has appointments set with two different specialist in hair loss. She feels conflicted about whether she will keep either one.  Past Medical History: Patient Active Problem List   Diagnosis Date Noted   Anemia 04/08/2021   Alopecia areata 03/31/2021   Essential hypertension 11/02/2018   Past Surgical History:  Procedure Laterality Date   CESAREAN SECTION     COSMETIC SURGERY     Family History  Problem Relation Age of Onset   Diabetes Mother    Heart disease Mother        CHF   Hyperlipidemia Mother    Hypertension Mother    Cancer Father        prostate   Heart disease Maternal Uncle    Diabetes Maternal Uncle    Heart disease Maternal Grandmother    Hyperlipidemia Maternal Grandmother    Hypertension Maternal Grandmother    Heart disease Maternal Grandfather    Outpatient Medications Prior to Visit  Medication Sig Dispense Refill   amLODipine (NORVASC) 10 MG tablet Take 1 tablet (10 mg total) by mouth daily. 90 tablet 3   lisinopril (ZESTRIL) 20 MG tablet TAKE 1 TABLET BY MOUTH EVERY DAY 90 tablet 3   OPZELURA 1.5 % CREA APPLY 1 DROP TOPICALLY IN THE MORNING AND AT BEDTIME. APPLY SMALL AMOUNT. 60 g 1   chlorthalidone (HYGROTON) 25 MG tablet Take 1 tablet (25 mg total)  by mouth daily. 90 tablet 0   No facility-administered medications prior to visit.   No Known Allergies    Objective:   Today's Vitals   02/25/22 1508 02/25/22 1511 02/25/22 1532  BP: (!) 154/86 (!) 152/82 140/78  Pulse: 83    Temp: 97.8 F (36.6 C)    TempSrc: Temporal    SpO2: 99%    Weight: 149 lb 9.6 oz (67.9 kg)    Height: 5' 4.5" (1.638 m)     Body mass index is 25.28 kg/m.   General: Well developed, well nourished. No acute distress. Psych: Alert and oriented. Normal mood and affect.  There are no preventive care reminders to display for this patient.     Assessment & Plan:   1. Essential hypertension Ms. Vieweg reports home blood pressures of 125/70 on average. I will plan to continue her on amlodipine 10 mg daily and lisinopril 20 mg daily. She has been on chlorthalidone. However, her insurance has noted her co-pay would be less with indapamide. We will give a trial of indapamide 2.5 mg daily. She will start tis after she uses up her current prescription. I will see her back in 3 months.  - indapamide (LOZOL) 2.5 MG tablet; Take 1 tablet (2.5 mg total) by mouth daily.  Dispense: 90 tablet; Refill: 3  2. Alopecia areata Ms. Tedrick will make  a decision about whether she seeks further specialty consultation related to her hair loss. She is worried that they will not have anything significant to offer.  Return in about 3 months (around 05/28/2022) for Reassessment.   [name], MD

## 2022-02-26 ENCOUNTER — Other Ambulatory Visit: Payer: Self-pay | Admitting: Family Medicine

## 2022-03-24 ENCOUNTER — Other Ambulatory Visit: Payer: Self-pay | Admitting: Family Medicine

## 2022-03-24 DIAGNOSIS — I10 Essential (primary) hypertension: Secondary | ICD-10-CM

## 2022-03-31 ENCOUNTER — Other Ambulatory Visit: Payer: Self-pay | Admitting: Family Medicine

## 2022-04-01 ENCOUNTER — Telehealth: Payer: Self-pay | Admitting: Family Medicine

## 2022-04-01 DIAGNOSIS — I10 Essential (primary) hypertension: Secondary | ICD-10-CM

## 2022-04-01 MED ORDER — CHLORTHALIDONE 25 MG PO TABS: 25.0000 mg | ORAL_TABLET | Freq: Every day | ORAL | 3 refills | Status: DC

## 2022-04-01 NOTE — Telephone Encounter (Signed)
Please review and advise. Thanks. Dm/cma  

## 2022-04-01 NOTE — Telephone Encounter (Signed)
Pt has recently been prescribed indapamide (LOZOL) 2.5 MG tablet [[id number]] she says this script is making her nauseous. She is willing to pay $25 for her other script. She would prefer  chlorthalidone (HYGROTON) 25 MG tablet [[id number]]  DISCONTINUED  [id number] [place], [place] - [address]  [address] [place] [address], [place] [address]  Phone:  [phone]  Fax:  [phone]  DEA #:  [id number]  Please advise pt at [phone]

## 2022-04-01 NOTE — Telephone Encounter (Signed)
Left detailed VM that RX was sent.  Dm/cma

## 2022-04-30 ENCOUNTER — Other Ambulatory Visit: Payer: Self-pay | Admitting: Family Medicine

## 2022-04-30 DIAGNOSIS — L639 Alopecia areata, unspecified: Secondary | ICD-10-CM

## 2022-05-27 ENCOUNTER — Ambulatory Visit: Payer: BC Managed Care – PPO | Admitting: Family Medicine

## 2022-06-21 ENCOUNTER — Other Ambulatory Visit: Payer: Self-pay | Admitting: Family Medicine

## 2022-06-21 DIAGNOSIS — I10 Essential (primary) hypertension: Secondary | ICD-10-CM

## 2022-08-25 ENCOUNTER — Encounter (INDEPENDENT_AMBULATORY_CARE_PROVIDER_SITE_OTHER): Payer: Self-pay

## 2022-08-31 ENCOUNTER — Ambulatory Visit: Payer: BC Managed Care – PPO | Admitting: Family Medicine

## 2022-09-07 ENCOUNTER — Ambulatory Visit: Payer: BC Managed Care – PPO | Admitting: Family Medicine

## 2022-09-09 ENCOUNTER — Ambulatory Visit: Payer: BC Managed Care – PPO | Admitting: Family Medicine

## 2022-09-09 ENCOUNTER — Encounter: Payer: Self-pay | Admitting: Family Medicine

## 2022-09-09 VITALS — BP 148/76 | HR 91 | Temp 97.7°F | Ht 64.5 in | Wt 153.2 lb

## 2022-09-09 DIAGNOSIS — M25561 Pain in right knee: Secondary | ICD-10-CM

## 2022-09-09 DIAGNOSIS — G8929 Other chronic pain: Secondary | ICD-10-CM | POA: Diagnosis not present

## 2022-09-09 DIAGNOSIS — I10 Essential (primary) hypertension: Secondary | ICD-10-CM | POA: Diagnosis not present

## 2022-09-09 MED ORDER — LISINOPRIL 40 MG PO TABS: 40.0000 mg | ORAL_TABLET | Freq: Every day | ORAL | 3 refills | Status: DC

## 2022-09-09 NOTE — Progress Notes (Signed)
[facility] [address] [place] [address] Dept: [phone] Dept Fax: [phone]  Chronic Care Office Visit  Subjective:    Patient ID: Jenny Brock, female    DOB: [birth year], 56 y.o..   MRN: [id number]  Chief Complaint  Patient presents with   Acute Visit    C/o having swelling in RT knee x 3 months.  Wearing knee brace.      History of Present Illness:  Patient is in today for reassessment of chronic medical issues.  Ms. Moose presents complaining of a 3 week history of right knee pain. She is active on her job at [employer], which may have contributed. The pain is just proximal to the knee, laterally. She also has some pain towards the popliteal area. She does not recall a specific injury.  Ms. Plate has a history of hypertension. She is managed on amlodipine 10 mg daily, chlorthalidone 25 mg daily, and lisinopril 20 mg daily. She notes her blood pressures at home are normal, so she is quite surprised at how high they are here.   Past Medical History: Patient Active Problem List   Diagnosis Date Noted   Anemia 04/08/2021   Alopecia areata 03/31/2021   Essential hypertension 11/02/2018   Past Surgical History:  Procedure Laterality Date   CESAREAN SECTION     COSMETIC SURGERY     Family History  Problem Relation Age of Onset   Diabetes Mother    Heart disease Mother        CHF   Hyperlipidemia Mother    Hypertension Mother    Cancer Father        prostate   Heart disease Maternal Uncle    Diabetes Maternal Uncle    Heart disease Maternal Grandmother    Hyperlipidemia Maternal Grandmother    Hypertension Maternal Grandmother    Heart disease Maternal Grandfather    Outpatient Medications Prior to Visit  Medication Sig Dispense Refill   amLODipine (NORVASC) 10 MG tablet TAKE 1 TABLET BY MOUTH EVERY DAY 90 tablet 0   chlorthalidone (HYGROTON) 25 MG tablet Take 1 tablet (25 mg total)  by mouth daily. 90 tablet 3   OPZELURA 1.5 % CREA APPLY 1 DROP TOPICALLY IN THE MORNING AND AT BEDTIME. APPLY SMALL AMOUNT. 60 g 1   lisinopril (ZESTRIL) 20 MG tablet TAKE 1 TABLET BY MOUTH EVERY DAY 90 tablet 3   No facility-administered medications prior to visit.   Allergies  Allergen Reactions   Indapamide Nausea Only     Objective:   Today's Vitals   09/09/22 1511  BP: (!) 154/78  Pulse: 91  Temp: 97.7 F (36.5 C)  TempSrc: Temporal  SpO2: 97%  Weight: 153 lb 3.2 oz (69.5 kg)  Height: 5' 4.5" (1.638 m)   Body mass index is 25.89 kg/m.   General: Well developed, well nourished. No acute distress. Extremities: Full ROM. Mild crepitance. No joint swelling. Varus/valgus testing normal. Lachman's neg.   McMurray's neg. There is a round fullness in the popliteal fossa. Psych: Alert and oriented. Normal mood and affect.  There are no preventive care reminders to display for this patient.    Assessment & Plan:   1. Essential hypertension Ms. Tomlinson's blood pressure is quite high. I will increase her lisinopril dose to 40 mg daily. She will continue her chlorthalidone and amlodipine. I will have her return in 1 month to reassess her blood pressure. I asked that she bring in her blood  rpessure cuff, so we can check it for accuracy.  - lisinopril (ZESTRIL) 40 MG tablet; Take 1 tablet (40 mg total) by mouth daily.  Dispense: 90 tablet; Refill: 3  2. Chronic pain of right knee I suspect Ms. Taulbee may have some mild arthritis. I also suspect she may have a Baker's cyst present. As we do not currently have x-ray available on site, I will refer her to Sports Medicine for an evaluation and to discuss treatment options.  - Ambulatory referral to Sports Medicine   Return in about 4 weeks (around 10/07/2022) for Reassessment.   Loyola Mast, MD

## 2022-09-15 ENCOUNTER — Ambulatory Visit: Payer: Self-pay

## 2022-09-15 ENCOUNTER — Ambulatory Visit: Payer: BC Managed Care – PPO | Admitting: Family Medicine

## 2022-09-15 ENCOUNTER — Encounter: Payer: Self-pay | Admitting: Family Medicine

## 2022-09-15 VITALS — BP 132/80 | Ht 64.0 in | Wt 148.0 lb

## 2022-09-15 DIAGNOSIS — M23300 Other meniscus derangements, unspecified lateral meniscus, right knee: Secondary | ICD-10-CM | POA: Diagnosis not present

## 2022-09-15 DIAGNOSIS — M25561 Pain in right knee: Secondary | ICD-10-CM

## 2022-09-15 NOTE — Patient Instructions (Signed)
Nice to meet you Please try ice as needed  Please try the exercises  Please try the brace  We can consider insoles or custom orthotics   Please send me a message in MyChart with any questions or updates.  Please see me back in 4 weeks.   --Dr. Jordan Likes

## 2022-09-15 NOTE — Progress Notes (Signed)
  Jenny Brock - 56 y.o. female MRN [id number]  Date of birth: [birth year]  SUBJECTIVE:  Including CC & ROS.  No chief complaint on file.   Jenny Brock is a 56 y.o. female that is presenting with acute on chronic right knee pain.  She has swelling in the posterior aspect as well as pain on the lateral aspect.  It occurs intermittently.  She does walk up and down stairs a lot for her work.  No history of surgery.   Review of Systems See HPI   HISTORY: Past Medical, Surgical, Social, and Family History Reviewed & Updated per EMR.   Pertinent Historical Findings include:  Past Medical History:  Diagnosis Date   Anemia    Hypertension     Past Surgical History:  Procedure Laterality Date   CESAREAN SECTION     COSMETIC SURGERY       PHYSICAL EXAM:  VS: BP 132/80   Ht 5\' 4"  (1.626 m)   Wt 148 lb (67.1 kg)   LMP 06/15/2019   BMI 25.40 kg/m  Physical Exam Gen: NAD, alert, cooperative with exam, well-appearing MSK:  Neurovascularly intact    Limited ultrasound: Right knee pain:  Mild effusion suprapatellar pouch. Normal-appearing quadricep and patellar tendon. No significant changes of the medial meniscus. Outpouching and hyperemia of the lateral meniscus. Mild Baker's cyst present  Summary: Findings consistent with degenerative meniscal tearing lateral meniscus  Ultrasound and interpretation by 08/15/2019, MD    ASSESSMENT & PLAN:   Degenerative tear of lateral meniscus of right knee Acutely occurring.  Changes occurring in the lateral compartment. -Counseled on home exercise therapy and supportive care. -Provided hinged knee brace. -Counseled on Voltaren. -Could consider custom orthotics, physical therapy or injection.

## 2022-09-15 NOTE — Assessment & Plan Note (Signed)
Acutely occurring.  Changes occurring in the lateral compartment. -Counseled on home exercise therapy and supportive care. -Provided hinged knee brace. -Counseled on Voltaren. -Could consider custom orthotics, physical therapy or injection.

## 2022-09-16 ENCOUNTER — Other Ambulatory Visit: Payer: Self-pay | Admitting: Family Medicine

## 2022-09-16 DIAGNOSIS — I1A0 Resistant hypertension: Secondary | ICD-10-CM

## 2022-09-17 ENCOUNTER — Other Ambulatory Visit: Payer: Self-pay | Admitting: Family Medicine

## 2022-09-17 DIAGNOSIS — L639 Alopecia areata, unspecified: Secondary | ICD-10-CM

## 2022-10-10 ENCOUNTER — Ambulatory Visit: Payer: BC Managed Care – PPO | Admitting: Family Medicine

## 2022-10-11 ENCOUNTER — Ambulatory Visit: Payer: BC Managed Care – PPO | Admitting: Family Medicine

## 2022-10-12 ENCOUNTER — Ambulatory Visit: Payer: BC Managed Care – PPO | Admitting: Family Medicine

## 2022-10-21 ENCOUNTER — Ambulatory Visit: Payer: BC Managed Care – PPO | Admitting: Family Medicine

## 2022-10-21 ENCOUNTER — Encounter: Payer: Self-pay | Admitting: Family Medicine

## 2022-10-21 VITALS — BP 146/60 | HR 110 | Temp 98.6°F | Ht 64.0 in | Wt 153.8 lb

## 2022-10-21 DIAGNOSIS — I10 Essential (primary) hypertension: Secondary | ICD-10-CM | POA: Diagnosis not present

## 2022-10-21 NOTE — Progress Notes (Signed)
[facility] [address] [place] [address] Dept: [phone] Dept Fax: [phone]  Chronic Care Office Visit  Subjective:    Patient ID: Jenny Brock, female    DOB: 1966/01/24, 57 y.o..   MRN: [id number]  Chief Complaint  Patient presents with   Office Visit    4 week f/u  No concerns     History of Present Illness:  Patient is in today for reassessment of chronic medical issues.  Ms. Wolford has a history of hypertension. She is managed on amlodipine 10 mg daily, chlorthalidone 25 mg daily, and lisinopril 20 mg daily. She has previously noted  her blood pressures at home are normal. She came here directly from work. She works for [employer] and does not she was working with a strong solvent today. She brought along her home blood pressure monitor and her log of blood pressures. In demonstrating how she uses this, I noted the cuff indicates she should use this on the left arm. She has been using on the right.  Past Medical History: Patient Active Problem List   Diagnosis Date Noted   Degenerative tear of lateral meniscus of right knee 09/15/2022   Anemia 04/08/2021   Alopecia areata 03/31/2021   Essential hypertension 11/02/2018   Past Surgical History:  Procedure Laterality Date   CESAREAN SECTION     COSMETIC SURGERY      Family History  Problem Relation Age of Onset   Diabetes Mother    Heart disease Mother        CHF   Hyperlipidemia Mother    Hypertension Mother    Cancer Father        prostate   Heart disease Maternal Uncle    Diabetes Maternal Uncle    Heart disease Maternal Grandmother    Hyperlipidemia Maternal Grandmother    Hypertension Maternal Grandmother    Heart disease Maternal Grandfather    Outpatient Medications Prior to Visit  Medication Sig Dispense Refill   amLODipine (NORVASC) 10 MG tablet TAKE 1 TABLET BY MOUTH EVERY DAY 90 tablet 0   chlorthalidone (HYGROTON) 25  MG tablet Take 1 tablet (25 mg total) by mouth daily. 90 tablet 3   lisinopril (ZESTRIL) 40 MG tablet Take 1 tablet (40 mg total) by mouth daily. 90 tablet 3   OPZELURA 1.5 % CREA APPLY 1 DROP TOPICALLY IN THE MORNING AND AT BEDTIME. APPLY SMALL AMOUNT. 60 g 1   No facility-administered medications prior to visit.   Allergies  Allergen Reactions   Indapamide Nausea Only     Objective:   Today's Vitals   10/21/22 1509  BP: (!) 146/60  Pulse: (!) 110  Temp: 98.6 F (37 C)  TempSrc: Temporal  SpO2: 99%  Weight: 153 lb 12.8 oz (69.8 kg)  Height: 5\' 4"  (1.626 m)   Body mass index is 26.4 kg/m.   General: Well developed, well nourished. No acute distress. Psych: Alert and oriented. Normal mood and affect.  There are no preventive care reminders to display for this patient.  Reviewed home blood pressure log- Average of last 7 Bps: 123/65    Assessment & Plan:   1. Essential hypertension I did a comparison between ms. Whalley's BP on our manual blood pressure cuff and her home blood pressure cuff, both taken in the left arm. Her pressure measured 160/82 and 162/83 respectively. This does suggest that she is getting accurate home pressures that are well controled. I will have her  continue to monitor this over the next month, taking her pressure in her left arm. I will see her back in 1 month to review her log. In the meantime, I will check her renal labs and double check her thyroid to make sure there is not another cause surging her BP. She will continue amlodipine 10 mg daily, chlorthalidone 25 mg daily, and lisinopril 20 mg daily.  - Basic metabolic panel - TSH - Microalbumin / creatinine urine ratio   Return in about 4 weeks (around 11/18/2022) for Reassessment.   [name], MD

## 2022-10-22 LAB — SPECIMEN STATUS REPORT

## 2022-10-24 ENCOUNTER — Ambulatory Visit: Payer: BC Managed Care – PPO | Admitting: Internal Medicine

## 2022-10-24 LAB — BASIC METABOLIC PANEL
BUN/Creatinine Ratio: 18 (ref 9–23)
BUN: 13 mg/dL (ref 6–24)
CO2: 27 mmol/L (ref 20–29)
Calcium: 10.4 mg/dL — ABNORMAL HIGH (ref 8.7–10.2)
Chloride: 99 mmol/L (ref 96–106)
Creatinine, Ser: 0.72 mg/dL (ref 0.57–1.00)
Glucose: 93 mg/dL (ref 70–99)
Potassium: 3.6 mmol/L (ref 3.5–5.2)
Sodium: 142 mmol/L (ref 134–144)
eGFR: 99 mL/min/{1.73_m2} (ref >59)

## 2022-10-24 LAB — MICROALBUMIN / CREATININE URINE RATIO

## 2022-10-24 LAB — TSH: TSH: 1.05 u[IU]/mL (ref 0.450–4.500)

## 2022-10-24 NOTE — Addendum Note (Signed)
Addended by: Alphonzo Lemmings on: 10/24/2022 11:33 AM   Modules accepted: Orders

## 2022-10-25 LAB — MICROALBUMIN / CREATININE URINE RATIO
Creatinine, Urine: 119.3 mg/dL
Microalb/Creat Ratio: 11 mg/g creat (ref 0–29)
Microalbumin, Urine: 13 ug/mL

## 2022-11-18 ENCOUNTER — Ambulatory Visit: Payer: BC Managed Care – PPO | Admitting: Family Medicine

## 2022-11-18 ENCOUNTER — Encounter: Payer: Self-pay | Admitting: Family Medicine

## 2022-11-18 VITALS — BP 152/78 | HR 68 | Temp 98.2°F | Ht 64.0 in | Wt 152.6 lb

## 2022-11-18 DIAGNOSIS — I10 Essential (primary) hypertension: Secondary | ICD-10-CM

## 2022-11-18 DIAGNOSIS — I1A0 Resistant hypertension: Secondary | ICD-10-CM | POA: Diagnosis not present

## 2022-11-18 MED ORDER — LOSARTAN POTASSIUM 50 MG PO TABS: 50.0000 mg | ORAL_TABLET | Freq: Every day | ORAL | 3 refills | Status: DC

## 2022-11-18 MED ORDER — AMLODIPINE BESYLATE 10 MG PO TABS: 10.0000 mg | ORAL_TABLET | Freq: Every day | ORAL | 3 refills | Status: DC

## 2022-11-18 NOTE — Progress Notes (Signed)
[facility] [address] [place] [address] Dept: [phone] Dept Fax: [phone]  Office Visit  Subjective:    Patient ID: Jenny Brock, female    DOB: 1966/09/19, 57 y.o..   MRN: [id number]  Chief Complaint  Patient presents with   Medical Management of Chronic Issues    C/o having sensitivity to sunlight and vision issues when taking the Lisinopril.  Stopped taking it 10 days ago.     History of Present Illness:  Patient is in today for reassessment of her blood pressure. She notes that since her last visit, she stopped her lisinopril. Jenny Brock notes that since she started taking lisinopril around 2019, she has had issues with photophobia and blurred vision at times. This seems to have worsened when her dose was increased from 20 mg to 40 mg daily. She notes at times, her eyelids swell shut when driving in bright light. She has also reports occasional double vision. She has continued to take her chlorthalidone 25 mg daily and amlodipine 10 mg daily. She feels these symptoms have greatly improved since stopping lisinopril about 10 days ago.  Past Medical History: Patient Active Problem List   Diagnosis Date Noted   Resistant hypertension 11/18/2022   Degenerative tear of lateral meniscus of right knee 09/15/2022   Anemia 04/08/2021   Alopecia areata 03/31/2021   Past Surgical History:  Procedure Laterality Date   CESAREAN SECTION     COSMETIC SURGERY     Family History  Problem Relation Age of Onset   Diabetes Mother    Heart disease Mother        CHF   Hyperlipidemia Mother    Hypertension Mother    Cancer Father        prostate   Heart disease Maternal Uncle    Diabetes Maternal Uncle    Heart disease Maternal Grandmother    Hyperlipidemia Maternal Grandmother    Hypertension Maternal Grandmother    Heart disease Maternal Grandfather    Outpatient Medications Prior to Visit  Medication Sig  Dispense Refill   chlorthalidone (HYGROTON) 25 MG tablet Take 1 tablet (25 mg total) by mouth daily. 90 tablet 3   OPZELURA 1.5 % CREA APPLY 1 DROP TOPICALLY IN THE MORNING AND AT BEDTIME. APPLY SMALL AMOUNT. 60 g 1   amLODipine (NORVASC) 10 MG tablet TAKE 1 TABLET BY MOUTH EVERY DAY 90 tablet 0   lisinopril (ZESTRIL) 40 MG tablet Take 1 tablet (40 mg total) by mouth daily. (Patient not taking: Reported on 11/18/2022) 90 tablet 3   No facility-administered medications prior to visit.   Allergies  Allergen Reactions   Indapamide Nausea Only   Lisinopril Other (See Comments)    Double vision, photophobia, eyelid swelling     Objective:   Today's Vitals   11/18/22 0823  BP: (!) 158/82  Pulse: 68  Temp: 98.2 F (36.8 C)  TempSrc: Temporal  SpO2: 98%  Weight: 152 lb 9.6 oz (69.2 kg)  Height: 5' 4"$  (1.626 m)   Body mass index is 26.19 kg/m.   General: Well developed, well nourished. No acute distress. Psych: Alert and oriented. Normal mood and affect.  There are no preventive care reminders to display for this patient.  Lab Results    Latest Ref Rng & Units 10/21/2022    4:00 PM 02/26/2021   10:28 AM 10/25/2019    4:11 PM  CMP  Glucose 70 - 99 mg/dL 93  92  81  BUN 6 - 24 mg/dL 13  20  16   $ Creatinine 0.57 - 1.00 mg/dL 0.72  0.78  0.93   Sodium 134 - 144 mmol/L 142  139  140   Potassium 3.5 - 5.2 mmol/L 3.6  3.5  3.2   Chloride 96 - 106 mmol/L 99  101  103   CO2 20 - 29 mmol/L 27  32  30   Calcium 8.7 - 10.2 mg/dL 10.4  9.7  10.2   Total Protein 6.1 - 8.1 g/dL   8.1   Total Bilirubin 0.2 - 1.2 mg/dL   0.6   AST 10 - 35 U/L   22   ALT 6 - 29 U/L   15       Assessment & Plan:   Problem List Items Addressed This Visit       Cardiovascular and Mediastinum   Resistant hypertension - Primary    The side effects Jenny Brock complains of have been attributed to lisinopril. I agree with her stopping the medication. I will try switching her to losartan 50 mg daily and  plan to see her back in 1 month. I will have her log her home BPs and bring this with her. She will continue amlodipine 10 mg daily and chlorthalidone 25 mg daily.      Relevant Medications   amLODipine (NORVASC) 10 MG tablet   losartan (COZAAR) 50 MG tablet    Return in about 4 weeks (around 12/16/2022) for Reassessment.   [name], MD

## 2022-11-18 NOTE — Assessment & Plan Note (Signed)
The side effects Jenny Brock complains of have been attributed to lisinopril. I agree with her stopping the medication. I will try switching her to losartan 50 mg daily and plan to see her back in 1 month. I will have her log her home BPs and bring this with her. She will continue amlodipine 10 mg daily and chlorthalidone 25 mg daily.

## 2022-11-25 ENCOUNTER — Ambulatory Visit: Payer: BC Managed Care – PPO | Admitting: Family Medicine

## 2022-11-26 ENCOUNTER — Other Ambulatory Visit: Payer: Self-pay | Admitting: Family Medicine

## 2022-11-26 DIAGNOSIS — L639 Alopecia areata, unspecified: Secondary | ICD-10-CM

## 2022-12-23 ENCOUNTER — Ambulatory Visit: Payer: BC Managed Care – PPO | Admitting: Family Medicine

## 2023-01-08 DIAGNOSIS — H16143 Punctate keratitis, bilateral: Secondary | ICD-10-CM | POA: Diagnosis not present

## 2023-01-08 DIAGNOSIS — H0288B Meibomian gland dysfunction left eye, upper and lower eyelids: Secondary | ICD-10-CM | POA: Diagnosis not present

## 2023-01-08 DIAGNOSIS — H0288A Meibomian gland dysfunction right eye, upper and lower eyelids: Secondary | ICD-10-CM | POA: Diagnosis not present

## 2023-01-16 ENCOUNTER — Encounter: Payer: Self-pay | Admitting: *Deleted

## 2023-01-30 ENCOUNTER — Other Ambulatory Visit: Payer: Self-pay | Admitting: Family Medicine

## 2023-01-30 DIAGNOSIS — Z1231 Encounter for screening mammogram for malignant neoplasm of breast: Secondary | ICD-10-CM

## 2023-02-03 DIAGNOSIS — H524 Presbyopia: Secondary | ICD-10-CM | POA: Diagnosis not present

## 2023-02-03 DIAGNOSIS — H04123 Dry eye syndrome of bilateral lacrimal glands: Secondary | ICD-10-CM | POA: Diagnosis not present

## 2023-02-08 DIAGNOSIS — Z01419 Encounter for gynecological examination (general) (routine) without abnormal findings: Secondary | ICD-10-CM | POA: Diagnosis not present

## 2023-02-08 DIAGNOSIS — Z6826 Body mass index (BMI) 26.0-26.9, adult: Secondary | ICD-10-CM | POA: Diagnosis not present

## 2023-02-17 ENCOUNTER — Ambulatory Visit: Payer: BC Managed Care – PPO | Admitting: Family Medicine

## 2023-02-28 ENCOUNTER — Ambulatory Visit
Admission: RE | Admit: 2023-02-28 | Discharge: 2023-02-28 | Disposition: A | Discharge status: Home / Self Care | Payer: BC Managed Care – PPO | Source: Ambulatory Visit | Attending: Family Medicine | Admitting: Family Medicine

## 2023-02-28 DIAGNOSIS — Z1231 Encounter for screening mammogram for malignant neoplasm of breast: Secondary | ICD-10-CM

## 2023-03-03 ENCOUNTER — Ambulatory Visit: Payer: BC Managed Care – PPO

## 2023-03-22 ENCOUNTER — Other Ambulatory Visit: Payer: Self-pay | Admitting: Family Medicine

## 2023-03-22 DIAGNOSIS — I10 Essential (primary) hypertension: Secondary | ICD-10-CM

## 2023-04-14 ENCOUNTER — Ambulatory Visit: Payer: BC Managed Care – PPO | Admitting: Family Medicine

## 2023-04-26 ENCOUNTER — Ambulatory Visit (HOSPITAL_COMMUNITY)
Admission: RE | Admit: 2023-04-26 | Discharge: 2023-04-26 | Disposition: A | Discharge status: Home / Self Care | Payer: BC Managed Care – PPO | Source: Ambulatory Visit | Attending: Nurse Practitioner | Admitting: Nurse Practitioner

## 2023-04-26 ENCOUNTER — Encounter (HOSPITAL_COMMUNITY): Payer: Self-pay

## 2023-04-26 VITALS — BP 180/73 | HR 78 | Temp 98.2°F | Resp 18

## 2023-04-26 DIAGNOSIS — H6122 Impacted cerumen, left ear: Secondary | ICD-10-CM | POA: Diagnosis not present

## 2023-04-26 NOTE — ED Triage Notes (Signed)
Pt c/o lt ear fullness x1wk. Denies pain. Pt c/o dry eyes. States given drops but not working.

## 2023-04-26 NOTE — ED Provider Notes (Signed)
[facility]    CSN: [id number] Arrival date & time: 04/26/23  1512      History   Chief Complaint Chief Complaint  Patient presents with   Ear Fullness    I have a clogged ear and nothing is helping. - Entered by patient    HPI Jenny Brock is a 57 y.o. female.   Patient presents today with left ear fullness for the past week.  She denies ear pain or drainage.  No recent fever, cough, congestion, or sore throat.  No recent water immersion.  Reports she has tried over-the-counter swimmer's ear drops without relief.  She does use Q-tips on a regular basis.    Past Medical History:  Diagnosis Date   Anemia    Hypertension     Patient Active Problem List   Diagnosis Date Noted   Resistant hypertension 11/18/2022   Degenerative tear of lateral meniscus of right knee 09/15/2022   Anemia 04/08/2021   Alopecia areata 03/31/2021    Past Surgical History:  Procedure Laterality Date   CESAREAN SECTION     COSMETIC SURGERY      OB History   No obstetric history on file.      Home Medications    Prior to Admission medications   Medication Sig Start Date End Date Taking? Authorizing Provider  amLODipine (NORVASC) 10 MG tablet Take 1 tablet (10 mg total) by mouth daily. 11/18/22   Loyola Mast, MD  chlorthalidone (HYGROTON) 25 MG tablet TAKE 1 TABLET (25 MG TOTAL) BY MOUTH DAILY. 03/22/23   Loyola Mast, MD  losartan (COZAAR) 50 MG tablet Take 1 tablet (50 mg total) by mouth daily. 11/18/22   Loyola Mast, MD  OPZELURA 1.5 % CREA APPLY 1 DROP TOPICALLY IN THE MORNING AND AT BEDTIME. APPLY SMALL AMOUNT. 11/28/22   Loyola Mast, MD    Family History Family History  Problem Relation Age of Onset   Diabetes Mother    Heart disease Mother        CHF   Hyperlipidemia Mother    Hypertension Mother    Cancer Father        prostate   Heart disease Maternal Uncle    Diabetes Maternal Uncle    Heart disease Maternal Grandmother    Hyperlipidemia  Maternal Grandmother    Hypertension Maternal Grandmother    Heart disease Maternal Grandfather     Social History Social History   Tobacco Use   Smoking status: Never   Smokeless tobacco: Never  Vaping Use   Vaping status: Never Used  Substance Use Topics   Alcohol use: Yes    Comment: social   Drug use: Not Currently     Allergies   Indapamide and Lisinopril   Review of Systems Review of Systems Per HPI  Physical Exam Triage Vital Signs ED Triage Vitals  Encounter Vitals Group     BP 04/26/23 1557 (!) 180/73     Systolic BP Percentile --      Diastolic BP Percentile --      Pulse Rate 04/26/23 1557 78     Resp 04/26/23 1557 18     Temp 04/26/23 1557 98.2 F (36.8 C)     Temp Source 04/26/23 1557 Oral     SpO2 04/26/23 1557 99 %     Weight --      Height --      Head Circumference --      Peak Flow --  Pain Score 04/26/23 1558 0     Pain Loc --      Pain Education --      Exclude from Growth Chart --    No data found.  Updated Vital Signs BP (!) 180/73 (BP Location: Left Arm)   Pulse 78   Temp 98.2 F (36.8 C) (Oral)   Resp 18   LMP 06/15/2019   SpO2 99%   Visual Acuity Right Eye Distance:   Left Eye Distance:   Bilateral Distance:    Right Eye Near:   Left Eye Near:    Bilateral Near:     Physical Exam Vitals and nursing note reviewed.  Constitutional:      General: She is not in acute distress.    Appearance: Normal appearance. She is not toxic-appearing.  HENT:     Right Ear: Tympanic membrane, ear canal and external ear normal. There is impacted cerumen.     Left Ear: There is impacted cerumen.     Ears:     Comments: Unable to visualize left TM    Nose: Nose normal. No congestion or rhinorrhea.     Mouth/Throat:     Mouth: Mucous membranes are moist.     Pharynx: Oropharynx is clear. No posterior oropharyngeal erythema.  Eyes:     General: No scleral icterus.    Extraocular Movements: Extraocular movements intact.   Cardiovascular:     Rate and Rhythm: Normal rate and regular rhythm.  Pulmonary:     Effort: Pulmonary effort is normal. No respiratory distress.  Musculoskeletal:     Cervical back: Normal range of motion.  Lymphadenopathy:     Cervical: No cervical adenopathy.  Skin:    General: Skin is warm and dry.     Capillary Refill: Capillary refill takes less than 2 seconds.     Coloration: Skin is not jaundiced or pale.     Findings: No erythema.  Neurological:     Mental Status: She is alert and oriented to person, place, and time.  Psychiatric:        Behavior: Behavior is cooperative.      UC Treatments / Results  Labs (all labs ordered are listed, but only abnormal results are displayed) Labs Reviewed - No data to display  EKG   Radiology No results found.  Procedures Ear Cerumen Removal  Date/Time: 04/26/2023 4:52 PM  Performed by: Valentino Nose, NP Authorized by: Valentino Nose, NP   Consent:    Consent obtained:  Verbal   Consent given by:  Patient   Risks discussed:  Pain, dizziness, incomplete removal and TM perforation   Alternatives discussed:  Alternative treatment Universal protocol:    Procedure explained and questions answered to patient or proxy's satisfaction: yes     Patient identity confirmed:  Verbally with patient Procedure details:    Location:  L ear   Procedure type: irrigation     Procedure outcomes: cerumen removed    (including critical care time)  Medications Ordered in UC Medications - No data to display  Initial Impression / Assessment and Plan / UC Course  I have reviewed the triage vital signs and the nursing notes.  Pertinent labs & imaging results that were available during my care of the patient were reviewed by me and considered in my medical decision making (see chart for details).   Patient is well-appearing, afebrile, not tachycardic, not tachypneic, oxygenating well on room air.  Patient is mildly hypertensive  in urgent care  today.  1. Excessive cerumen in left ear canal Ear lavage provided with relief Patient got frustrated with the wait time and left before I could reassess her ear and eardrum, but stated that she felt much better and could now hear out of the ear  The patient was given the opportunity to ask questions.  All questions answered to their satisfaction.  The patient is in agreement to this plan.    Final Clinical Impressions(s) / UC Diagnoses   Final diagnoses:  Excessive cerumen in left ear canal   Discharge Instructions   None    ED Prescriptions   None    PDMP not reviewed this encounter.   Valentino Nose, NP 04/26/23 [phone]

## 2023-06-19 ENCOUNTER — Telehealth: Payer: Self-pay | Admitting: Family Medicine

## 2023-06-19 NOTE — Telephone Encounter (Signed)
Pt scheduled an appt via mychart for--blood pressure getting higher. I spoke with pt she stated her bp gets up to 155-145 at work. She also gets dizzy and a headache during these times. I offered to move her appt sooner, she can not because of her job. I contacted nurse triage, they will call her back, she is aware.

## 2023-06-20 NOTE — Telephone Encounter (Signed)
Lft VM to rtn call to try and get her scheduled sooner. Dm/cma

## 2023-06-22 ENCOUNTER — Telehealth: Payer: Self-pay

## 2023-06-22 NOTE — Progress Notes (Signed)
Pharmacy Quality Measure Review  This patient is appearing on a report for being at risk of failing the Controlling Blood Pressure measure this calendar year.   Last documented BP 152/78 on 2/16  -Patient states she has been experiencing elevated BP readings specifically at/after work and believes medications may need to be adjusted -Scheduled to see Dr. Veto Kemps 10/4 -Coming to see me for follow-up BP reading(s) 9/25  Lenna Gilford, PharmD, DPLA

## 2023-06-22 NOTE — Telephone Encounter (Signed)
Lft VM to rtn call to try and get her scheduled sooner. Dm/cma

## 2023-06-23 NOTE — Telephone Encounter (Signed)
Spoke to patient, she is coming in on 06/28/23 to see the nurse for he BP readings and will keep the appointment with Dr Veto Kemps on 07/07/23.  Dm/cma  (Patient currently failing TNM: HTN in Black or African American population.  I am seeing patient at [facility] 9/25 for f/u BP reading(s).)

## 2023-06-23 NOTE — Telephone Encounter (Signed)
Noted. Dm/cma

## 2023-06-28 ENCOUNTER — Other Ambulatory Visit: Payer: BC Managed Care – PPO

## 2023-06-28 NOTE — Progress Notes (Signed)
06/28/2023 Name: Jenny Brock MRN: [id number] DOB: January 22, 1966  Chief Complaint  Patient presents with   Hypertension   Jenny Brock is a 57 y.o. year old female who presents for face-to-face visit to address blood pressure control after being identified by quality report as currently failing TNM: HTN  Subjective:  Care Team: Primary Care Provider: Loyola Mast, MD ; Next Scheduled Visit: 10/4  Medication Access/Adherence  Current Pharmacy:  [id number] [place], [place] - [address] [place] [address] Phone: [phone] Fax: [phone]  [id number] - [place], [place] - [address] [place] [address] Phone: [phone] Fax: [phone]  -Patient reports affordability concerns with their medications: No  -Patient reports access/transportation concerns to their pharmacy: No  -Patient reports adherence concerns with their medications:  No    Hypertension: Current medications: amlodipine 10mg  daily, chlorthalidone 25mg  daily, losartan 50mg  daily -Medications previously tried: lisinopril and indapamide- patient could not tolerate -Patient has a validated, automated, upper arm home BP cuff -Current blood pressure readings readings: 142/73 on second check today -Patient denies hypotensive s/sx including dizziness, lightheadedness.  -Patient reports hypertensive symptoms including headache at times when BP is elevated -Patient states BP normal when at home but elevated when at work/after work due to stress  Objective: Lab Results  Component Value Date   CREATININE 0.72 10/21/2022   BUN 13 10/21/2022   NA 142 10/21/2022   K 3.6 10/21/2022   CL 99 10/21/2022   CO2 27 10/21/2022   Medications Reviewed Today     Reviewed by Lenna Gilford, RPH (Pharmacist) on 06/28/23 at 1503  Med List Status: <None>   Medication Order Taking? Sig Documenting Provider Last Dose Status Informant  amLODipine  (NORVASC) 10 MG tablet [id number] Yes Take 1 tablet (10 mg total) by mouth daily. Loyola Mast, MD Taking Active   chlorthalidone (HYGROTON) 25 MG tablet [id number] Yes TAKE 1 TABLET (25 MG TOTAL) BY MOUTH DAILY. Loyola Mast, MD Taking Active   losartan (COZAAR) 50 MG tablet [id number] Yes Take 1 tablet (50 mg total) by mouth daily. Loyola Mast, MD Taking Active   OPZELURA 1.5 % CREA [id number] Yes APPLY 1 DROP TOPICALLY IN THE MORNING AND AT BEDTIME. APPLY SMALL AMOUNT. Loyola Mast, MD Taking Active            Assessment/Plan:   Hypertension: - Currently uncontrolled - Suggested taking 75mg  (1.5 tablet) losartan on work days, 50mg  off days - Record BP daily and monitor - Bring BP log and home monitor to upcoming 10/4 appointment with Dr. Veto Kemps  Follow Up Plan: None scheduled at this time but can as recommended per PCP  Lenna Gilford, PharmD, DPLA

## 2023-07-05 ENCOUNTER — Ambulatory Visit: Payer: BC Managed Care – PPO | Admitting: Family Medicine

## 2023-07-05 ENCOUNTER — Encounter: Payer: Self-pay | Admitting: Family Medicine

## 2023-07-05 VITALS — BP 160/82 | HR 76 | Temp 98.6°F | Ht 64.0 in | Wt 152.6 lb

## 2023-07-05 DIAGNOSIS — L639 Alopecia areata, unspecified: Secondary | ICD-10-CM

## 2023-07-05 DIAGNOSIS — I1A0 Resistant hypertension: Secondary | ICD-10-CM

## 2023-07-05 MED ORDER — OPZELURA 1.5 % EX CREA
1.0000 [drp] | TOPICAL_CREAM | Freq: Two times a day (BID) | CUTANEOUS | 1 refills | Status: DC
Start: 2023-07-05 — End: 2023-10-15

## 2023-07-05 NOTE — Progress Notes (Signed)
[facility] [address] [place] [address] Dept: [phone] Dept Fax: [phone]  Chronic Care Office Visit  Subjective:    Patient ID: Jenny Brock, female    DOB: Sep 11, 1966, 57 y.o..   MRN: [id number]  Chief Complaint  Patient presents with   Hypertension    F/u HTN.  At home BP 118/60, 129/60   History of Present Illness:  Patient is in today for reassessment of chronic medical issues.  Jenny Brock has a history of resistant hypertension. She is currently managed on amlodipine 10 mg daily, chlorthalidone 25 mg daily, and losartan 50 mg daily. She had a telephone encounter with Sabino Niemann, PharmD recently. Jenny Brock noted her home BP was good on days when she is not at work, but runs hihg on work days. They discussed her adding in an extra 1/2 pill of losartan (total dose of 75 mg) on work days. Jenny Brock notes she tried this today, but felt weak and nauseous this morning at work. She typically takes her medicine first thing in the morning.   Jenny Brock has a history of alopecia areata. She is currently managed on Opzelura (ruxolitinib) 1.5% cream. She notes this has allowed for some "fuzz" to grow on her scalp. I had previously been scheduled to see a dermatology specialist in [place], but never went. She notes that she doesn't have much hope for improvement.   Past Medical History: Patient Active Problem List   Diagnosis Date Noted   Resistant hypertension 11/18/2022   Degenerative tear of lateral meniscus of right knee 09/15/2022   Anemia 04/08/2021   Alopecia areata 03/31/2021   Past Surgical History:  Procedure Laterality Date   CESAREAN SECTION     COSMETIC SURGERY     Family History  Problem Relation Age of Onset   Diabetes Mother    Heart disease Mother        CHF   Hyperlipidemia Mother    Hypertension Mother    Cancer Father        prostate   Heart disease Maternal Uncle     Diabetes Maternal Uncle    Heart disease Maternal Grandmother    Hyperlipidemia Maternal Grandmother    Hypertension Maternal Grandmother    Heart disease Maternal Grandfather    Outpatient Medications Prior to Visit  Medication Sig Dispense Refill   amLODipine (NORVASC) 10 MG tablet Take 1 tablet (10 mg total) by mouth daily. 90 tablet 3   chlorthalidone (HYGROTON) 25 MG tablet TAKE 1 TABLET (25 MG TOTAL) BY MOUTH DAILY. 90 tablet 3   losartan (COZAAR) 50 MG tablet Take 1 tablet (50 mg total) by mouth daily. 90 tablet 3   OPZELURA 1.5 % CREA APPLY 1 DROP TOPICALLY IN THE MORNING AND AT BEDTIME. APPLY SMALL AMOUNT. 60 g 1   No facility-administered medications prior to visit.   Allergies  Allergen Reactions   Indapamide Nausea Only   Lisinopril Other (See Comments)    Double vision, photophobia, eyelid swelling   Objective:   Today's Vitals   07/05/23 1529  BP: (!) 160/78  Pulse: 76  Temp: 98.6 F (37 C)  TempSrc: Temporal  SpO2: 100%  Weight: 152 lb 9.6 oz (69.2 kg)  Height: 5\' 4"  (1.626 m)   Body mass index is 26.19 kg/m.   General: Well developed, well nourished. No acute distress. Psych: Alert and oriented. Normal mood and affect.  Health Maintenance Due  Topic Date Due   Cervical  Cancer Screening (HPV/Pap Cotest)  Never done     Assessment & Plan:   Problem List Items Addressed This Visit       Cardiovascular and Mediastinum   Resistant hypertension - Primary     Musculoskeletal and Integument   Alopecia areata    Return in about 4 weeks (around 08/02/2023) for Reassessment.   Loyola Mast, MD

## 2023-07-05 NOTE — Patient Instructions (Signed)
Take losartan 50 mg 1 1/2 tabs before bedtime on nights before work.

## 2023-07-05 NOTE — Assessment & Plan Note (Signed)
Continue Opzelura (ruxolitinib) 1.5% cream

## 2023-07-05 NOTE — Assessment & Plan Note (Signed)
Bp remains high today. I recommend she try taking her losartan at bedtime and agree with the plan to take losartan 50 mg 1 1/2 tabs at bedtime the night before workdays. She will continue amlodipine 10 mg daily and chlorthalidone 25 mg daily. I plan to see her back in 1 month.

## 2023-07-07 ENCOUNTER — Ambulatory Visit: Payer: BC Managed Care – PPO | Admitting: Family Medicine

## 2023-07-13 ENCOUNTER — Ambulatory Visit: Payer: BC Managed Care – PPO | Admitting: Internal Medicine

## 2023-08-02 ENCOUNTER — Ambulatory Visit: Payer: BC Managed Care – PPO | Admitting: Family Medicine

## 2023-08-03 ENCOUNTER — Ambulatory Visit: Payer: BC Managed Care – PPO | Admitting: Family Medicine

## 2023-08-08 ENCOUNTER — Encounter: Payer: Self-pay | Admitting: Family Medicine

## 2023-08-08 ENCOUNTER — Ambulatory Visit: Payer: BC Managed Care – PPO | Admitting: Family Medicine

## 2023-08-08 VITALS — BP 126/74 | HR 86 | Temp 98.2°F | Ht 64.0 in | Wt 156.8 lb

## 2023-08-08 DIAGNOSIS — I1A0 Resistant hypertension: Secondary | ICD-10-CM | POA: Diagnosis not present

## 2023-08-08 NOTE — Assessment & Plan Note (Signed)
BP is better after rest in the clinic and consistent with her home BPs. She did not tolerate the extra dose of losartan, so I will have her keep to her prior regimen of amlodipine 10 mg daily, losartan 50 mg daily and chlorthalidone 25 mg daily.

## 2023-08-08 NOTE — Progress Notes (Signed)
[facility] [address] [place] [address] Dept: [phone] Dept Fax: [phone]  Office Visit  Subjective:    Patient ID: Jenny Brock, female    DOB: May 30, 1966, 57 y.o..   MRN: [id number]  Chief Complaint  Patient presents with   Hypertension    F/u HTN.  Average BP 112/63 - 128/59.     History of Present Illness:  Patient is in today for reassessment of her blood pressure. Jenny Brock has a history of resistant hypertension. She is currently managed on amlodipine 10 mg daily, chlorthalidone 25 mg daily, and losartan 50 mg daily. She had a telephone encounter with Sabino Niemann, PharmD in late Sept. Jenny Brock had noted her home BP was good on days when she is not at work, but runs high on work days. They discussed her adding in an extra 1/2 pill of losartan (total dose of 75 mg) on work days. Jenny Brock has been unable to tolerate the extra losartan, noting it makes her feel a bit week. She did bring in her home BP log. Her 7-day average is 123/63.  Past Medical History: Patient Active Problem List   Diagnosis Date Noted   Resistant hypertension 11/18/2022   Degenerative tear of lateral meniscus of right knee 09/15/2022   Anemia 04/08/2021   Alopecia areata 03/31/2021   Past Surgical History:  Procedure Laterality Date   CESAREAN SECTION     COSMETIC SURGERY     Family History  Problem Relation Age of Onset   Diabetes Mother    Heart disease Mother        CHF   Hyperlipidemia Mother    Hypertension Mother    Cancer Father        prostate   Heart disease Maternal Uncle    Diabetes Maternal Uncle    Heart disease Maternal Grandmother    Hyperlipidemia Maternal Grandmother    Hypertension Maternal Grandmother    Heart disease Maternal Grandfather    Outpatient Medications Prior to Visit  Medication Sig Dispense Refill   amLODipine (NORVASC) 10 MG tablet Take 1 tablet (10 mg total) by mouth  daily. 90 tablet 3   chlorthalidone (HYGROTON) 25 MG tablet TAKE 1 TABLET (25 MG TOTAL) BY MOUTH DAILY. 90 tablet 3   losartan (COZAAR) 50 MG tablet Take 1 tablet (50 mg total) by mouth daily. 90 tablet 3   Ruxolitinib Phosphate (OPZELURA) 1.5 % CREA Apply 1 drop topically in the morning and at bedtime. Apply small amount. 60 g 1   No facility-administered medications prior to visit.   Allergies  Allergen Reactions   Indapamide Nausea Only   Lisinopril Other (See Comments)    Double vision, photophobia, eyelid swelling     Objective:   Today's Vitals   08/08/23 1541 08/08/23 1618  BP: (!) 146/72 126/74  Pulse: 86   Temp: 98.2 F (36.8 C)   TempSrc: Temporal   SpO2: 98%   Weight: 156 lb 12.8 oz (71.1 kg)   Height: 5\' 4"  (1.626 m)    Body mass index is 26.91 kg/m.   General: Well developed, well nourished. No acute distress. Psych: Alert and oriented. Normal mood and affect.  Health Maintenance Due  Topic Date Due   Cervical Cancer Screening (HPV/Pap Cotest)  Never done     Assessment & Plan:   Problem List Items Addressed This Visit       Cardiovascular and Mediastinum   Resistant hypertension - Primary  BP is better after rest in the clinic and consistent with her home BPs. She did not tolerate the extra dose of losartan, so I will have her keep to her prior regimen of amlodipine 10 mg daily, losartan 50 mg daily and chlorthalidone 25 mg daily.        Return in about 3 months (around 11/08/2023) for Reassessment.   Loyola Mast, MD

## 2023-08-24 ENCOUNTER — Encounter: Payer: Self-pay | Admitting: Family Medicine

## 2023-08-24 DIAGNOSIS — L639 Alopecia areata, unspecified: Secondary | ICD-10-CM

## 2023-08-28 NOTE — Addendum Note (Signed)
Addended by: Loyola Mast on: 08/28/2023 05:24 PM   Modules accepted: Orders

## 2023-10-15 ENCOUNTER — Other Ambulatory Visit: Payer: Self-pay | Admitting: Family Medicine

## 2023-10-15 DIAGNOSIS — L639 Alopecia areata, unspecified: Secondary | ICD-10-CM

## 2023-11-02 ENCOUNTER — Other Ambulatory Visit: Payer: Self-pay | Admitting: Family Medicine

## 2023-11-02 DIAGNOSIS — I1A0 Resistant hypertension: Secondary | ICD-10-CM

## 2023-11-20 ENCOUNTER — Encounter: Payer: Self-pay | Admitting: Family Medicine

## 2023-11-28 ENCOUNTER — Other Ambulatory Visit: Payer: Self-pay | Admitting: Family Medicine

## 2023-11-28 DIAGNOSIS — I1A0 Resistant hypertension: Secondary | ICD-10-CM

## 2023-12-15 ENCOUNTER — Ambulatory Visit: Payer: BC Managed Care – PPO | Admitting: Family Medicine

## 2023-12-20 ENCOUNTER — Ambulatory Visit: Payer: BC Managed Care – PPO | Admitting: Family Medicine

## 2023-12-24 IMAGING — MG MM DIGITAL SCREENING BILAT W/ TOMO AND CAD
8 series · 8 of 24 positions shown · non-contrast
Comparison: Previous exam(s).

CLINICAL DATA: Screening.

EXAM:
DIGITAL SCREENING BILATERAL MAMMOGRAM WITH TOMOSYNTHESIS AND CAD
TECHNIQUE: Bilateral screening digital craniocaudal and mediolateral oblique
mammograms were obtained. Bilateral screening digital breast
tomosynthesis was performed. The images were evaluated with
computer-aided detection.

[L MLO synth-2D]
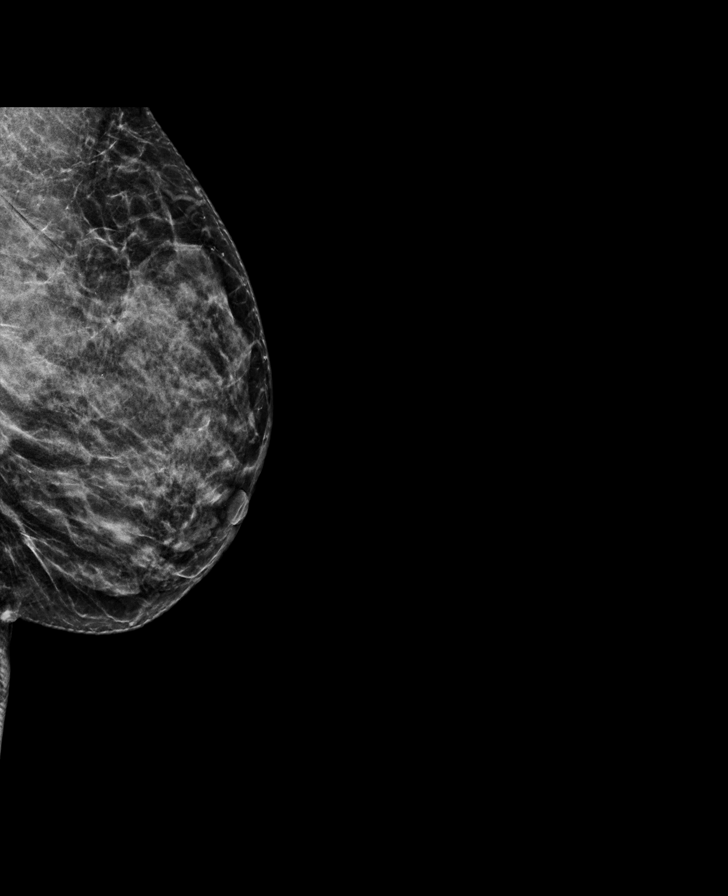

[L CC synth-2D]
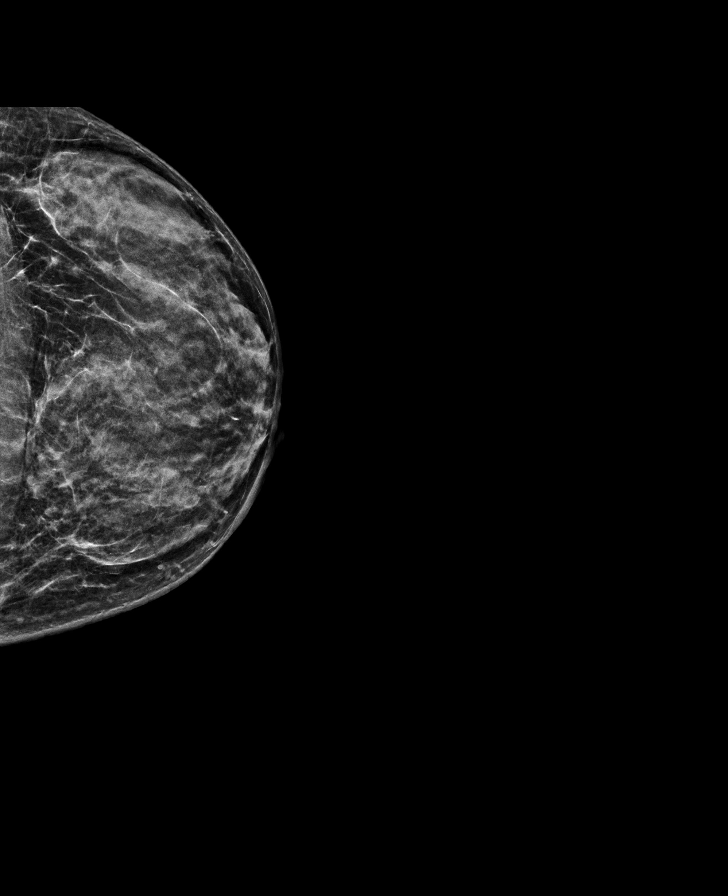

[R MLO synth-2D]
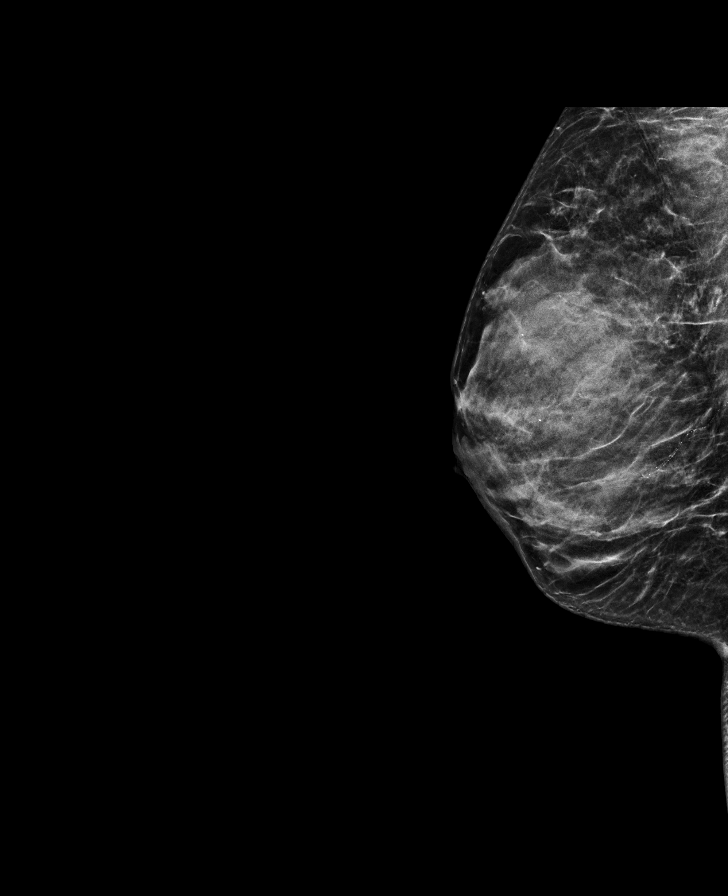

[R CC synth-2D]
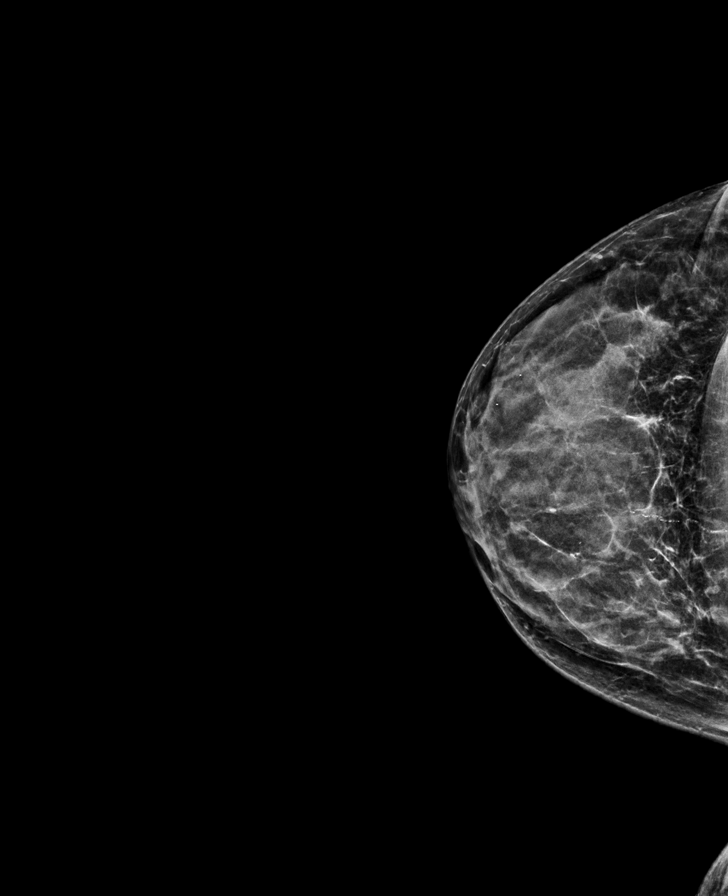

[R CC tomo · tomo slice 23/45.0]
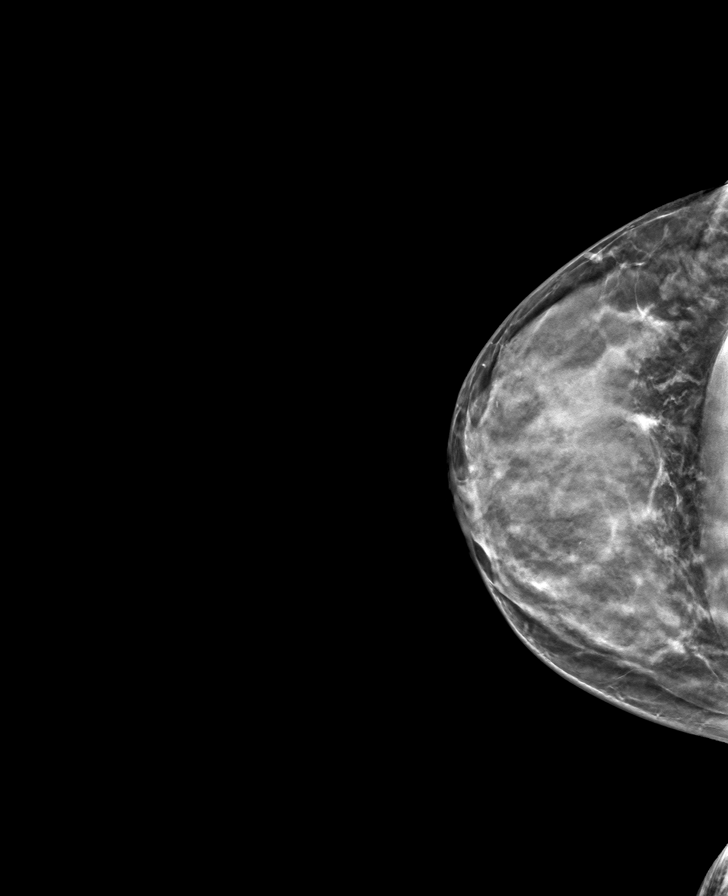

[L CC tomo · tomo slice 21/40.0]
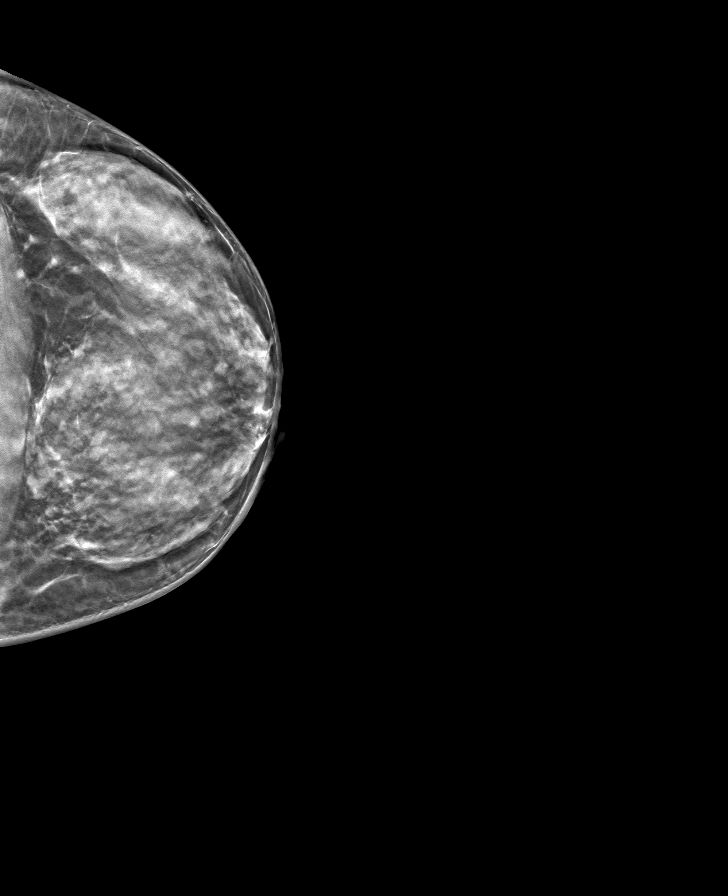

[R MLO tomo · tomo slice 21/41.0]
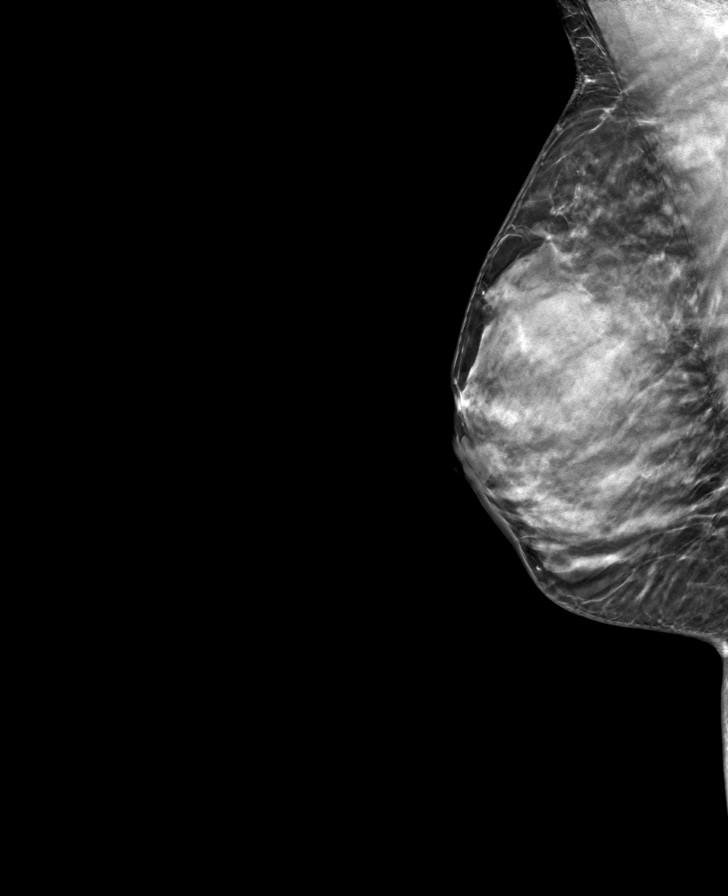

[L MLO tomo · tomo slice 22/43.0]
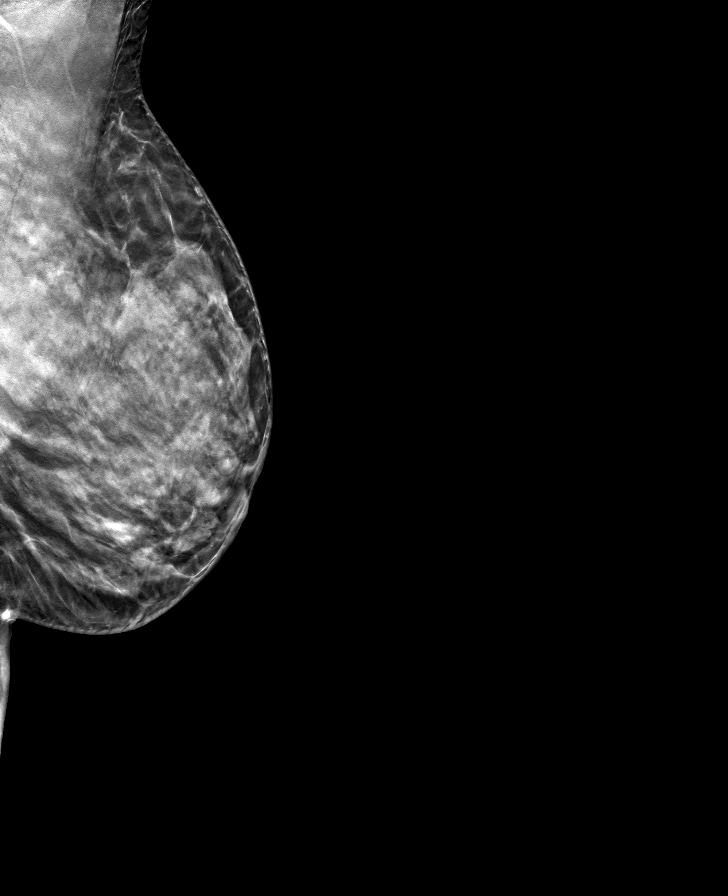

[8 of 24 positions shown; findings below may reference images not displayed]

ACR Breast Density Category d: The breast tissue is extremely dense,
which lowers the sensitivity of mammography
FINDINGS: There are no findings suspicious for malignancy.
IMPRESSION: No mammographic evidence of malignancy. A result letter of this
screening mammogram will be mailed directly to the patient.

RECOMMENDATION:
Screening mammogram in one year. (Code:TA-V-WV9)

BI-RADS CATEGORY  1: Negative.

## 2023-12-29 ENCOUNTER — Encounter: Payer: Self-pay | Admitting: Family Medicine

## 2023-12-29 ENCOUNTER — Ambulatory Visit: Admitting: Family Medicine

## 2023-12-29 VITALS — BP 140/78 | HR 104 | Temp 98.0°F | Ht 64.0 in | Wt 152.6 lb

## 2023-12-29 DIAGNOSIS — Z13220 Encounter for screening for lipoid disorders: Secondary | ICD-10-CM | POA: Diagnosis not present

## 2023-12-29 DIAGNOSIS — I1A0 Resistant hypertension: Secondary | ICD-10-CM

## 2023-12-29 DIAGNOSIS — E876 Hypokalemia: Secondary | ICD-10-CM | POA: Diagnosis not present

## 2023-12-29 NOTE — Progress Notes (Signed)
 [facility] [address] [place] [address] Dept: [phone] Dept Fax: [phone]  Chronic Care Office Visit  Subjective:    Patient ID: Jenny Brock, female    DOB: 1966/07/24, 58 y.o..   MRN: [id number]  Chief Complaint  Patient presents with   Hypertension    3 month f/u HTN.  Average BP 117/64 - 129/66.   History of Present Illness:  Patient is in today for reassessment of chronic medical issues.  Ms. Jinkins has a history of resistant hypertension. She is currently managed on amlodipine 10 mg daily, chlorthalidone 25 mg daily, and losartan 50 mg daily. She often does have an elevated BP on presentation to the clinic, however, her home BPs has been doing well. Her 7-day average is 124/66.  Past Medical History: Patient Active Problem List   Diagnosis Date Noted   Resistant hypertension 11/18/2022   Degenerative tear of lateral meniscus of right knee 09/15/2022   Anemia 04/08/2021   Alopecia areata 03/31/2021   Past Surgical History:  Procedure Laterality Date   CESAREAN SECTION     COSMETIC SURGERY     Family History  Problem Relation Age of Onset   Diabetes Mother    Heart disease Mother        CHF   Hyperlipidemia Mother    Hypertension Mother    Cancer Father        prostate   Heart disease Maternal Uncle    Diabetes Maternal Uncle    Heart disease Maternal Grandmother    Hyperlipidemia Maternal Grandmother    Hypertension Maternal Grandmother    Heart disease Maternal Grandfather    Outpatient Medications Prior to Visit  Medication Sig Dispense Refill   amLODipine (NORVASC) 10 MG tablet TAKE 1 TABLET BY MOUTH EVERY DAY 90 tablet 3   chlorthalidone (HYGROTON) 25 MG tablet TAKE 1 TABLET (25 MG TOTAL) BY MOUTH DAILY. 90 tablet 3   losartan (COZAAR) 50 MG tablet TAKE 1 TABLET BY MOUTH EVERY DAY 90 tablet 3   OPZELURA 1.5 % CREA APPLY 1 DROP TOPICALLY IN THE MORNING AND AT BEDTIME. APPLY  SMALL AMOUNT. 60 g 1   No facility-administered medications prior to visit.   Allergies  Allergen Reactions   Indapamide Nausea Only   Lisinopril Other (See Comments)    Double vision, photophobia, eyelid swelling   Objective:   Today's Vitals   12/29/23 1505  BP: (!) 140/78  Pulse: (!) 104  Temp: 98 F (36.7 C)  TempSrc: Temporal  SpO2: 99%  Weight: 152 lb 9.6 oz (69.2 kg)  Height: 5\' 4"  (1.626 m)   Body mass index is 26.19 kg/m.   General: Well developed, well nourished. No acute distress. Psych: Alert and oriented. Normal mood and affect.  Health Maintenance Due  Topic Date Due   Cervical Cancer Screening (HPV/Pap Cotest)  Never done     Assessment & Plan:   Problem List Items Addressed This Visit       Cardiovascular and Mediastinum   Resistant hypertension - Primary   BP is at goal at home. Continue amlodipine 10 mg daily, losartan 50 mg daily and chlorthalidone 25 mg daily.  I will check annual renal labs.      Relevant Orders   Basic metabolic panel with GFR   Other Visit Diagnoses       Screening for lipid disorders       Relevant Orders   Lipid panel  Return in about 4 months (around 04/29/2024) for Reassessment.   Loyola Mast, MD

## 2023-12-29 NOTE — Assessment & Plan Note (Signed)
 BP is at goal at home. Continue amlodipine 10 mg daily, losartan 50 mg daily and chlorthalidone 25 mg daily.  I will check annual renal labs.

## 2023-12-30 LAB — LIPID PANEL
Cholesterol: 207 mg/dL — ABNORMAL HIGH
HDL: 82 mg/dL
LDL Cholesterol (Calc): 115 mg/dL — ABNORMAL HIGH
Non-HDL Cholesterol (Calc): 125 mg/dL
Total CHOL/HDL Ratio: 2.5 (calc)
Triglycerides: 35 mg/dL

## 2023-12-30 LAB — BASIC METABOLIC PANEL WITH GFR
BUN: 16 mg/dL (ref 7–25)
CO2: 30 mmol/L (ref 20–32)
Calcium: 10.1 mg/dL (ref 8.6–10.4)
Chloride: 101 mmol/L (ref 98–110)
Creat: 0.7 mg/dL (ref 0.50–1.03)
Glucose, Bld: 73 mg/dL (ref 65–99)
Potassium: 3.1 mmol/L — ABNORMAL LOW (ref 3.5–5.3)
Sodium: 140 mmol/L (ref 135–146)
eGFR: 101 mL/min/1.73m2

## 2023-12-31 LAB — HM MAMMOGRAPHY

## 2023-12-31 LAB — HM PAP SMEAR

## 2023-12-31 LAB — RESULTS CONSOLE HPV: CHL HPV: NEGATIVE

## 2024-01-01 ENCOUNTER — Encounter: Payer: Self-pay | Admitting: Family Medicine

## 2024-01-01 DIAGNOSIS — E876 Hypokalemia: Secondary | ICD-10-CM | POA: Insufficient documentation

## 2024-01-01 MED ORDER — POTASSIUM CHLORIDE CRYS ER 20 MEQ PO TBCR
20.0000 meq | EXTENDED_RELEASE_TABLET | Freq: Every day | ORAL | 3 refills | Status: AC
Start: 2024-01-01 — End: ?

## 2024-01-01 NOTE — Addendum Note (Signed)
 Addended by: Loyola Mast on: 01/01/2024 08:01 AM   Modules accepted: Orders

## 2024-01-12 DIAGNOSIS — H04123 Dry eye syndrome of bilateral lacrimal glands: Secondary | ICD-10-CM | POA: Diagnosis not present

## 2024-02-23 ENCOUNTER — Other Ambulatory Visit: Payer: Self-pay | Admitting: Family Medicine

## 2024-02-23 DIAGNOSIS — I10 Essential (primary) hypertension: Secondary | ICD-10-CM

## 2024-03-01 DIAGNOSIS — Z1231 Encounter for screening mammogram for malignant neoplasm of breast: Secondary | ICD-10-CM | POA: Diagnosis not present

## 2024-03-01 DIAGNOSIS — Z124 Encounter for screening for malignant neoplasm of cervix: Secondary | ICD-10-CM | POA: Diagnosis not present

## 2024-03-01 DIAGNOSIS — Z1151 Encounter for screening for human papillomavirus (HPV): Secondary | ICD-10-CM | POA: Diagnosis not present

## 2024-03-01 DIAGNOSIS — Z01419 Encounter for gynecological examination (general) (routine) without abnormal findings: Secondary | ICD-10-CM | POA: Diagnosis not present

## 2024-03-01 DIAGNOSIS — Z6826 Body mass index (BMI) 26.0-26.9, adult: Secondary | ICD-10-CM | POA: Diagnosis not present

## 2024-03-01 LAB — HM MAMMOGRAPHY

## 2024-03-14 ENCOUNTER — Encounter: Payer: Self-pay | Admitting: Family Medicine

## 2024-04-23 ENCOUNTER — Encounter: Payer: Self-pay | Admitting: Dermatology

## 2024-04-23 ENCOUNTER — Ambulatory Visit: Payer: BC Managed Care – PPO | Admitting: Dermatology

## 2024-04-23 VITALS — BP 178/84

## 2024-04-23 DIAGNOSIS — Z7189 Other specified counseling: Secondary | ICD-10-CM | POA: Diagnosis not present

## 2024-04-23 DIAGNOSIS — Z79899 Other long term (current) drug therapy: Secondary | ICD-10-CM

## 2024-04-23 DIAGNOSIS — L639 Alopecia areata, unspecified: Secondary | ICD-10-CM | POA: Diagnosis not present

## 2024-04-23 NOTE — Progress Notes (Signed)
 New Patient Visit   Subjective  Jenny Brock is a 58 y.o. female who presents for the following: Biopsy proven alopecia areata - She was prescribed Opzelura  but it did not help much. She noticed bald spots as early as age 62-10 but it always grew back. In 2013, it started coming out worse and has continued.    The following portions of the chart were reviewed this encounter and updated as appropriate: medications, allergies, medical history  Review of Systems:  No other skin or systemic complaints except as noted in HPI or Assessment and Plan.  Objective  Well appearing patient in no apparent distress; mood and affect are within normal limits.   A focused examination was performed of the following areas: Scalp  Relevant exam findings are noted in the Assessment and Plan.          Assessment & Plan   ALOPECIA AREATA -  - Assessment: Long-standing alopecia areata in ophiasis pattern since age 52, confirmed by biopsy in 2022. Symptoms worsened in 2013. Characterized by immune-mediated attack on hair follicles, typically triggered by stress. Previous treatments, including topical steroids, have been ineffective due to deep-seated inflammation. Patient was previously considered for oral JAK inhibitor therapy (Olumiant) in 2019 but did not receive a prescription. SALT score: 80%  - Plan:    Initiate Litfulo (oral JAK inhibitor) pending lab results     - Provided 2 sample bottles     - Informed consent obtained; discussed potential side effects including immune suppression     - Instructions: Do not start medication until cleared by lab results and provider message    Order pre-treatment labs:     - Complete blood count (CBC)     - Comprehensive metabolic panel (CMP)     - Lipid panel     - Tuberculosis (TB) test    Patient to complete fasting labs at [facility] within the week    Review lab results and message patient with clearance to start Litfulo    Patient  education:     - Educate on expected timeline for hair regrowth (3-6 months, up to 1 year for long-standing cases)     - Advise on precautions while on JAK inhibitors:       - Avoid live vaccines       - Wear mask when traveling       - Pause medication if illness occurs  Discussed potential risks/benefits of JAK inhibitors (Rinvoq/Cibinqo) which are new oral biologic medications used for treating severe, refractory atopic dermatitis.  They are very effective in resolving itchy eczema rashes of the skin.  Overall potential side effects are very minimal and mild and they are much safer than oral steroids. The black box warning for the JAK inhibitor class was discussed, including rare cardiovascular effects, thrombosis, and serious bacterial and viral infections, such as TB or herpes zoster, although the severe CV side effects have not been seen with Rinvoq or Cibinqo in their safety trials.  Shingles vaccination may be recommended prior to starting medication.  Baseline lab tests are done prior to initiation of medication to ensure pt has no underlying lab abnormalities or infection, and then rechecked at 3 months.  If normal, then periodic doctor visits and annual labs are performed for long term medication management.  ALOPECIA AREATA   Related Procedures Comprehensive metabolic panel CBC with Differential/Platelet QuantiFERON-TB Gold Plus Lipid Panel Related Medications OPZELURA  1.5 % CREA APPLY 1 DROP TOPICALLY IN THE MORNING  AND AT BEDTIME. APPLY SMALL AMOUNT.  Return in about 4 months (around 08/24/2024) for Alopecia.  I, Roseline Hutchinson, CMA, am acting as scribe for [employer], DO .   Documentation: I have reviewed the above documentation for accuracy and completeness, and I agree with the above.  Delon Lenis, DO

## 2024-04-23 NOTE — Patient Instructions (Addendum)
 Date: Tue Apr 23 2024  Hello Lajuanna,  Thank you for visiting today. Here is a summary of the key instructions:  - Medications:   - Do not start Litfulo until instructed after lab results are reviewed   - Use topical treatment as previously prescribed  - Lab Tests:   - Get the following lab tests done at [facility] this week:     - Complete Blood Count (CBC)     - Comprehensive Metabolic Panel (CMP)     - Lipid panel     - Tuberculosis (TB) test   - Fast before the lab tests, only drink water  - Lifestyle Changes:   - Watch your diet and avoid saturated fats   - Wear a mask when traveling   - Avoid live vaccines while on Litfulo  - Follow-up:   - Return for a follow-up appointment in 4 months  - Instructions:   - Use MyChart for any questions   - Use the [employer] Savings Card to help with Litfulo coverage   - If you get sick, you might need to pause Litfulo (contact the office)   - Expect potential hair regrowth in 3-6 months, or up to a year  Please reach out if you have any questions or concerns.  Warm regards,  Dr. Delon Lenis, Dermatology       Important Information  Due to recent changes in healthcare laws, you may see results of your pathology and/or laboratory studies on MyChart before the doctors have had a chance to review them. We understand that in some cases there may be results that are confusing or concerning to you. Please understand that not all results are received at the same time and often the doctors may need to interpret multiple results in order to provide you with the best plan of care or course of treatment. Therefore, we ask that you please give us  2 business days to thoroughly review all your results before contacting the office for clarification. Should we see a critical lab result, you will be contacted sooner.   If You Need Anything After Your Visit  If you have any questions or concerns for your doctor, please call our main line at  [phone] If no one answers, please leave a voicemail as directed and we will return your call as soon as possible. Messages left after 4 pm will be answered the following business day.   You may also send us  a message via MyChart. We typically respond to MyChart messages within 1-2 business days.  For prescription refills, please ask your pharmacy to contact our office. Our fax number is [phone].  If you have an urgent issue when the clinic is closed that cannot wait until the next business day, you can page your doctor at the number below.    Please note that while we do our best to be available for urgent issues outside of office hours, we are not available 24/7.   If you have an urgent issue and are unable to reach us , you may choose to seek medical care at your doctor's office, retail clinic, urgent care center, or emergency room.  If you have a medical emergency, please immediately call 911 or go to the emergency department. In the event of inclement weather, please call our main line at [phone] for an update on the status of any delays or closures.  Dermatology Medication Tips: Please keep the boxes that topical medications come in in order to help keep  track of the instructions about where and how to use these. Pharmacies typically print the medication instructions only on the boxes and not directly on the medication tubes.   If your medication is too expensive, please contact our office at [phone] or send us  a message through MyChart.   We are unable to tell what your co-pay for medications will be in advance as this is different depending on your insurance coverage. However, we may be able to find a substitute medication at lower cost or fill out paperwork to get insurance to cover a needed medication.   If a prior authorization is required to get your medication covered by your insurance company, please allow us  1-2 business days to complete this process.  Drug  prices often vary depending on where the prescription is filled and some pharmacies may offer cheaper prices.  The website www.goodrx.com contains coupons for medications through different pharmacies. The prices here do not account for what the cost may be with help from insurance (it may be cheaper with your insurance), but the website can give you the price if you did not use any insurance.  - You can print the associated coupon and take it with your prescription to the pharmacy.  - You may also stop by our office during regular business hours and pick up a GoodRx coupon card.  - If you need your prescription sent electronically to a different pharmacy, notify our office through [facility] or by phone at [phone]

## 2024-04-24 DIAGNOSIS — L639 Alopecia areata, unspecified: Secondary | ICD-10-CM | POA: Diagnosis not present

## 2024-04-26 ENCOUNTER — Ambulatory Visit: Admitting: Family Medicine

## 2024-04-26 VITALS — BP 154/86 | HR 105 | Temp 98.4°F | Ht 63.0 in | Wt 145.8 lb

## 2024-04-26 DIAGNOSIS — L639 Alopecia areata, unspecified: Secondary | ICD-10-CM | POA: Diagnosis not present

## 2024-04-26 DIAGNOSIS — I1A0 Resistant hypertension: Secondary | ICD-10-CM

## 2024-04-26 MED ORDER — AMLODIPINE BESYLATE 10 MG PO TABS
10.0000 mg | ORAL_TABLET | Freq: Every day | ORAL | 3 refills | Status: AC
Start: 2024-04-26 — End: ?

## 2024-04-26 NOTE — Progress Notes (Signed)
 [facility] [address] [place] [address] Dept: [phone] Dept Fax: [phone]  Chronic Care Office Visit  Subjective:    Patient ID: Jenny Brock, female    DOB: 1966/05/18, 58 y.o..   MRN: [id number]  Chief Complaint  Patient presents with   Hypertension    Patient wants to ask about a bill from last lab. States [employer] never charges her.    History of Present Illness:  Patient is in today for reassessment of chronic medical issues.  Jenny Brock has a history of resistant hypertension. She has shown a discordance between BP int he clinic and what she gets at home consistent with white coat hypertension. She is currently managed on amlodipine  10 mg daily, chlorthalidone  25 mg daily, and losartan  50 mg daily. Her 7-day average at home is 124/57.   Jenny Brock has a history of alopecia areata. She failed therapy with Opzelura  (ruxolitinib) 1.5% cream. She is undergoing preliminary testing before starting on Litfulo (ritlecitinib).   Past Medical History: Patient Active Problem List   Diagnosis Date Noted   Hypokalemia 01/01/2024   Resistant hypertension 11/18/2022   Degenerative tear of lateral meniscus of right knee 09/15/2022   Alopecia areata 03/31/2021   Past Surgical History:  Procedure Laterality Date   CESAREAN SECTION     COSMETIC SURGERY     Family History  Problem Relation Age of Onset   Diabetes Mother    Heart disease Mother        CHF   Hyperlipidemia Mother    Hypertension Mother    Cancer Father        prostate   Heart disease Maternal Uncle    Diabetes Maternal Uncle    Heart disease Maternal Grandmother    Hyperlipidemia Maternal Grandmother    Hypertension Maternal Grandmother    Heart disease Maternal Grandfather    Outpatient Medications Prior to Visit  Medication Sig Dispense Refill   chlorthalidone  (HYGROTON ) 25 MG tablet TAKE 1 TABLET (25 MG TOTAL) BY MOUTH DAILY. 90  tablet 3   losartan  (COZAAR ) 50 MG tablet TAKE 1 TABLET BY MOUTH EVERY DAY 90 tablet 3   potassium chloride  SA (KLOR-CON  M) 20 MEQ tablet Take 1 tablet (20 mEq total) by mouth daily. 90 tablet 3   amLODipine  (NORVASC ) 10 MG tablet TAKE 1 TABLET BY MOUTH EVERY DAY 90 tablet 3   OPZELURA  1.5 % CREA APPLY 1 DROP TOPICALLY IN THE MORNING AND AT BEDTIME. APPLY SMALL AMOUNT. (Patient not taking: Reported on 04/26/2024) 60 g 1   No facility-administered medications prior to visit.   Allergies  Allergen Reactions   Indapamide  Nausea Only   Lisinopril  Other (See Comments)    Double vision, photophobia, eyelid swelling   Objective:   Today's Vitals   04/26/24 1503  BP: (!) 154/86  Pulse: (!) 105  Temp: 98.4 F (36.9 C)  TempSrc: Oral  SpO2: 98%  Weight: 145 lb 12.8 oz (66.1 kg)  Height: 5' 3 (1.6 m)   Body mass index is 25.83 kg/m.   General: Well developed, well nourished. No acute distress. Psych: Alert and oriented. Normal mood and affect.  Health Maintenance Due  Topic Date Due   Hepatitis B Vaccines (1 of 3 - 19+ 3-dose series) Never done     Assessment & Plan:   Problem List Items Addressed This Visit       Cardiovascular and Mediastinum   Resistant hypertension - Primary   BP  is at goal at home. Continue amlodipine  10 mg daily, losartan  50 mg daily and chlorthalidone  25 mg daily.       Relevant Medications   amLODipine  (NORVASC ) 10 MG tablet     Musculoskeletal and Integument   Alopecia areata   Working with Dr. Alm to start on Litfulo (ritlecitinib).       Return in about 4 months (around 08/27/2024) for Reassessment.   Garnette CHRISTELLA Simpler, MD

## 2024-04-26 NOTE — Assessment & Plan Note (Signed)
 Working with Dr. Alm to start on Litfulo (ritlecitinib).

## 2024-04-26 NOTE — Assessment & Plan Note (Signed)
 BP is at goal at home. Continue amlodipine  10 mg daily, losartan  50 mg daily and chlorthalidone  25 mg daily.

## 2024-04-28 LAB — COMPREHENSIVE METABOLIC PANEL WITH GFR
ALT: 14 IU/L (ref 0–32)
AST: 18 IU/L (ref 0–40)
Albumin: 4.3 g/dL (ref 3.8–4.9)
Alkaline Phosphatase: 70 IU/L (ref 44–121)
BUN/Creatinine Ratio: 11 (ref 9–23)
BUN: 11 mg/dL (ref 6–24)
Bilirubin Total: 0.5 mg/dL (ref 0.0–1.2)
CO2: 27 mmol/L (ref 20–29)
Calcium: 10.2 mg/dL (ref 8.7–10.2)
Chloride: 103 mmol/L (ref 96–106)
Creatinine, Ser: 0.99 mg/dL (ref 0.57–1.00)
Globulin, Total: 3.3 g/dL (ref 1.5–4.5)
Glucose: 79 mg/dL (ref 70–99)
Potassium: 3.6 mmol/L (ref 3.5–5.2)
Sodium: 145 mmol/L — ABNORMAL HIGH (ref 134–144)
Total Protein: 7.6 g/dL (ref 6.0–8.5)
eGFR: 67 mL/min/1.73 (ref >59)

## 2024-04-28 LAB — QUANTIFERON-TB GOLD PLUS
QuantiFERON Mitogen Value: >10 [IU]/mL
QuantiFERON Nil Value: 0.11 [IU]/mL
QuantiFERON TB1 Ag Value: 0.08 [IU]/mL
QuantiFERON TB2 Ag Value: 0.09 [IU]/mL
QuantiFERON-TB Gold Plus: NEGATIVE

## 2024-04-28 LAB — CBC WITH DIFFERENTIAL/PLATELET
Basophils Absolute: 0 x10E3/uL (ref 0.0–0.2)
Basos: 1 %
EOS (ABSOLUTE): 0.1 x10E3/uL (ref 0.0–0.4)
Eos: 2 %
Hematocrit: 33.3 % — ABNORMAL LOW (ref 34.0–46.6)
Hemoglobin: 11.2 g/dL (ref 11.1–15.9)
Immature Grans (Abs): 0 x10E3/uL (ref 0.0–0.1)
Immature Granulocytes: 0 %
Lymphocytes Absolute: 3 x10E3/uL (ref 0.7–3.1)
Lymphs: 47 %
MCH: 30.7 pg (ref 26.6–33.0)
MCHC: 33.6 g/dL (ref 31.5–35.7)
MCV: 91 fL (ref 79–97)
Monocytes Absolute: 0.6 x10E3/uL (ref 0.1–0.9)
Monocytes: 10 %
Neutrophils Absolute: 2.6 x10E3/uL (ref 1.4–7.0)
Neutrophils: 40 %
Platelets: 202 x10E3/uL (ref 150–450)
RBC: 3.65 x10E6/uL — ABNORMAL LOW (ref 3.77–5.28)
RDW: 11.9 % (ref 11.7–15.4)
WBC: 6.3 x10E3/uL (ref 3.4–10.8)

## 2024-04-28 LAB — LIPID PANEL
Chol/HDL Ratio: 2.6 ratio (ref 0.0–4.4)
Cholesterol, Total: 203 mg/dL — ABNORMAL HIGH (ref 100–199)
HDL: 79 mg/dL (ref >39)
LDL Chol Calc (NIH): 112 mg/dL — ABNORMAL HIGH (ref 0–99)
Triglycerides: 66 mg/dL (ref 0–149)
VLDL Cholesterol Cal: 12 mg/dL (ref 5–40)

## 2024-04-30 ENCOUNTER — Ambulatory Visit: Payer: Self-pay | Admitting: Dermatology

## 2024-05-02 NOTE — Telephone Encounter (Signed)
-----   Message from Delon Lenis sent at 04/30/2024  8:28 AM EDT ----- HI Jovanni Eckhart,  The pt's labs were WNL and the tb screening was clear.  Please send rx paper work to [employer]. -Dr D  ----- Message ----- From: Interface, [facility] Lab Results In Sent: 04/28/2024   7:37 AM EDT To: Delon LOISE Lenis, DO

## 2024-05-02 NOTE — Telephone Encounter (Signed)
 Left message for patient to call office for results. I also need her signature on the Litfulo paperwork and a copy of her insurance card to start the approval process/hd

## 2024-05-08 NOTE — Telephone Encounter (Signed)
 Advised patient labs ere WNL. She will stop by the office 05/07/2024 to sign new paperwork for Litfulo/hd

## 2024-05-08 NOTE — Telephone Encounter (Signed)
-----   Message from Delon Lenis sent at 04/30/2024  8:28 AM EDT ----- HI Jenny Brock,  The pt's labs were WNL and the tb screening was clear.  Please send rx paper work to [employer]. -Dr D  ----- Message ----- From: Interface, [facility] Lab Results In Sent: 04/28/2024   7:37 AM EDT To: Delon LOISE Lenis, DO

## 2024-05-20 ENCOUNTER — Other Ambulatory Visit: Payer: Self-pay

## 2024-05-20 MED ORDER — LITFULO 50 MG PO CAPS: 1.0000 | ORAL_CAPSULE | Freq: Every day | ORAL | 5 refills | Status: DC

## 2024-09-16 ENCOUNTER — Ambulatory Visit: Admitting: Dermatology

## 2024-09-16 ENCOUNTER — Encounter: Payer: Self-pay | Admitting: Dermatology

## 2024-09-16 VITALS — BP 137/85

## 2024-09-16 DIAGNOSIS — L639 Alopecia areata, unspecified: Secondary | ICD-10-CM

## 2024-09-16 DIAGNOSIS — Z79899 Other long term (current) drug therapy: Secondary | ICD-10-CM

## 2024-09-16 DIAGNOSIS — E7800 Pure hypercholesterolemia, unspecified: Secondary | ICD-10-CM

## 2024-09-16 MED ORDER — LITFULO 50 MG PO CAPS: 1.0000 | ORAL_CAPSULE | Freq: Every day | ORAL | 11 refills | Status: DC

## 2024-09-16 NOTE — Patient Instructions (Addendum)
 VISIT SUMMARY:  Today, we discussed your ongoing treatment for alopecia areata and reviewed your cholesterol levels. You have been on Litfulo  for five months and have seen some improvement in your eyelashes and eyebrows, but not yet on your scalp. We also talked about your slightly elevated cholesterol levels and ways to manage them through diet and supplements.  YOUR PLAN:  -ALOPECIA AREATA:  Alopecia areata is a condition that causes hair loss. You have shown some improvement in your eyelashes and eyebrows with Litfulo  treatment, and there is potential for further regrowth. You should continue with Litfulo  as prescribed, and we will reassess your progress in May, with a backup plan for September if needed.  -HYPERCHOLESTEROLEMIA:  Hypercholesterolemia means having high cholesterol levels, which can increase the risk of heart disease. Given your family history, it is important to manage your cholesterol through diet and supplements. You should reduce your intake of saturated fats and oils, and start taking fish oil supplements as directed on the package. We will re-evaluate your cholesterol levels with a lab test.  INSTRUCTIONS:  Please continue taking Litfulo  as prescribed and follow the dietary recommendations to reduce your cholesterol levels. Start taking fish oil supplements as directed. We will re-evaluate your cholesterol levels with a lab test. Your next follow-up appointment is scheduled for May, with a backup plan for September if May is unavailable.  Important Information  Due to recent changes in healthcare laws, you may see results of your pathology and/or laboratory studies on MyChart before the doctors have had a chance to review them. We understand that in some cases there may be results that are confusing or concerning to you. Please understand that not all results are received at the same time and often the doctors may need to interpret multiple results in order to provide you  with the best plan of care or course of treatment. Therefore, we ask that you please give us  2 business days to thoroughly review all your results before contacting the office for clarification. Should we see a critical lab result, you will be contacted sooner.   If You Need Anything After Your Visit  If you have any questions or concerns for your doctor, please call our main line at [phone] If no one answers, please leave a voicemail as directed and we will return your call as soon as possible. Messages left after 4 pm will be answered the following business day.   You may also send us  a message via MyChart. We typically respond to MyChart messages within 1-2 business days.  For prescription refills, please ask your pharmacy to contact our office. Our fax number is [phone].  If you have an urgent issue when the clinic is closed that cannot wait until the next business day, you can page your doctor at the number below.    Please note that while we do our best to be available for urgent issues outside of office hours, we are not available 24/7.   If you have an urgent issue and are unable to reach us , you may choose to seek medical care at your doctor's office, retail clinic, urgent care center, or emergency room.  If you have a medical emergency, please immediately call 911 or go to the emergency department. In the event of inclement weather, please call our main line at [phone] for an update on the status of any delays or closures.  Dermatology Medication Tips: Please keep the boxes that topical medications come in in order to  help keep track of the instructions about where and how to use these. Pharmacies typically print the medication instructions only on the boxes and not directly on the medication tubes.   If your medication is too expensive, please contact our office at [phone] or send us  a message through MyChart.   We are unable to tell what your co-pay for  medications will be in advance as this is different depending on your insurance coverage. However, we may be able to find a substitute medication at lower cost or fill out paperwork to get insurance to cover a needed medication.   If a prior authorization is required to get your medication covered by your insurance company, please allow us  1-2 business days to complete this process.  Drug prices often vary depending on where the prescription is filled and some pharmacies may offer cheaper prices.  The website www.goodrx.com contains coupons for medications through different pharmacies. The prices here do not account for what the cost may be with help from insurance (it may be cheaper with your insurance), but the website can give you the price if you did not use any insurance.  - You can print the associated coupon and take it with your prescription to the pharmacy.  - You may also stop by our office during regular business hours and pick up a GoodRx coupon card.  - If you need your prescription sent electronically to a different pharmacy, notify our office through [facility] or by phone at [phone]

## 2024-09-16 NOTE — Progress Notes (Signed)
" ° °  Follow-Up Visit   Subjective  Jenny Brock is a 58 y.o. female established patient who presents for FOLLOW UP on the diagnoses listed below:  Patient (and/or pt guardian) consented to the use of AI-assisted tools for note generation.  Patient was last evaluated on 04/23/24.   Alopecia Areata: Pt started Litfulo  50mg  capsules once daily in July 2025. She stated that she has seen improvement with her eyebrows. In regard to the scalp she said it looks better.    Are you nursing, pregnant or trying to conceive? No   The following portions of the chart were reviewed this encounter and updated as appropriate: medications, allergies, medical history  Review of Systems:  No other skin or systemic complaints except as noted in HPI or Assessment and Plan.  Objective  Well appearing patient in no apparent distress; mood and affect are within normal limits.   A focused examination was performed of the following areas: scalp   Relevant exam findings are noted in the Assessment and Plan.          Assessment & Plan   Alopecia areata Chronic alopecia areata with partial response to Litfulo  treatment. Improvement in eyelashes and eyebrows with some baby hair growth. No adverse side effects reported. Hair follicles are not scarred, indicating potential for further regrowth. Long-term treatment may be necessary as hair follicles may take time to fully awaken. - Continue Litfulo  treatment with new prescription having twelve refills. - Scheduled follow-up appointment in May, with a backup plan for September if May is unavailable.  Hypercholesterolemia Mild hypercholesterolemia with a family history of heart disease. Cholesterol levels slightly elevated, not requiring immediate medication. Dietary modifications discussed, emphasizing reduction of saturated fats and oils. Fish oil supplementation recommended as a non-pharmacological intervention. Genetic predisposition to heart disease  noted, increasing risk due to lack of estrogen protection post-menopause. - Recommended fish oil supplementation, following package instructions. - Advised dietary modifications to reduce saturated fats and oils. - Provided lab slip for cholesterol re-evaluation.   No follow-ups on file.   Documentation: I have reviewed the above documentation for accuracy and completeness, and I agree with the above.  I, Shirron Maranda, CMA II, am acting as scribe for:  Delon Lenis, DO "

## 2024-09-17 ENCOUNTER — Ambulatory Visit: Payer: Self-pay | Admitting: Dermatology

## 2024-09-17 LAB — CBC
Hematocrit: 33 % — ABNORMAL LOW (ref 34.0–46.6)
Hemoglobin: 11.2 g/dL (ref 11.1–15.9)
MCH: 30.6 pg (ref 26.6–33.0)
MCHC: 33.9 g/dL (ref 31.5–35.7)
MCV: 90 fL (ref 79–97)
Platelets: 221 x10E3/uL (ref 150–450)
RBC: 3.66 x10E6/uL — ABNORMAL LOW (ref 3.77–5.28)
RDW: 11.6 % — ABNORMAL LOW (ref 11.7–15.4)
WBC: 5.9 x10E3/uL (ref 3.4–10.8)

## 2024-09-17 LAB — LIPID PANEL
Chol/HDL Ratio: 2.5 ratio (ref 0.0–4.4)
Cholesterol, Total: 222 mg/dL — ABNORMAL HIGH (ref 100–199)
HDL: 89 mg/dL (ref >39)
LDL Chol Calc (NIH): 123 mg/dL — ABNORMAL HIGH (ref 0–99)
Triglycerides: 58 mg/dL (ref 0–149)
VLDL Cholesterol Cal: 10 mg/dL (ref 5–40)

## 2024-09-17 LAB — COMPREHENSIVE METABOLIC PANEL WITH GFR
ALT: 20 IU/L (ref 0–32)
AST: 24 IU/L (ref 0–40)
Albumin: 4.4 g/dL (ref 3.8–4.9)
Alkaline Phosphatase: 74 IU/L (ref 49–135)
BUN/Creatinine Ratio: 16 (ref 9–23)
BUN: 13 mg/dL (ref 6–24)
Bilirubin Total: 0.3 mg/dL (ref 0.0–1.2)
CO2: 30 mmol/L — ABNORMAL HIGH (ref 20–29)
Calcium: 10.6 mg/dL — ABNORMAL HIGH (ref 8.7–10.2)
Chloride: 99 mmol/L (ref 96–106)
Creatinine, Ser: 0.83 mg/dL (ref 0.57–1.00)
Globulin, Total: 3.5 g/dL (ref 1.5–4.5)
Glucose: 87 mg/dL (ref 70–99)
Potassium: 3.8 mmol/L (ref 3.5–5.2)
Sodium: 140 mmol/L (ref 134–144)
Total Protein: 7.9 g/dL (ref 6.0–8.5)
eGFR: 83 mL/min/1.73 (ref >59)

## 2024-09-17 NOTE — Progress Notes (Signed)
 Hi Shirron,  We saw this pt together yesterday.  Her labs were within normal range to start Litflulo 50mg  daily.  Here cholesterol was a tab high so she shold monitor her saturated fat intake.  We will recheck her labs in 3-4 months after starting the medication to make sure nothing is elevating after starting.

## 2024-10-01 ENCOUNTER — Ambulatory Visit: Admitting: Family Medicine

## 2024-10-01 ENCOUNTER — Encounter: Payer: Self-pay | Admitting: Family Medicine

## 2024-10-01 VITALS — BP 138/82 | HR 93 | Temp 97.7°F | Ht 63.0 in | Wt 149.0 lb

## 2024-10-01 DIAGNOSIS — L639 Alopecia areata, unspecified: Secondary | ICD-10-CM | POA: Diagnosis not present

## 2024-10-01 DIAGNOSIS — I1A0 Resistant hypertension: Secondary | ICD-10-CM

## 2024-10-01 MED ORDER — LOSARTAN POTASSIUM 50 MG PO TABS: 50.0000 mg | ORAL_TABLET | Freq: Every day | ORAL | 3 refills | Status: AC

## 2024-10-01 NOTE — Assessment & Plan Note (Addendum)
 Some early response. Continue Litfulo  (ritlecitinib) and following with Dr. Alm.

## 2024-10-01 NOTE — Assessment & Plan Note (Addendum)
 BP is acceptable today. We discussed her obtaining a new home BP monitor. Continue amlodipine  10 mg daily, losartan  50 mg daily and chlorthalidone  25 mg daily.

## 2024-10-01 NOTE — Assessment & Plan Note (Signed)
 May be transient. Plan to reassess on her next blood draw.

## 2024-10-01 NOTE — Progress Notes (Signed)
 " [facility] [address] [place] [address] Dept: [phone] Dept Fax: [phone]  Chronic Care Office Visit  Subjective:    Patient ID: Jenny Brock, female    DOB: 1966-04-17, 58 y.o..   MRN: [id number]  Chief Complaint  Patient presents with   Hypertension    F/u HTN.   No concerns.    History of Present Illness:  Patient is in today for reassessment of chronic medical conditions.   [name] has a history of resistant hypertension. She has shown a discordance between BP in the clinic and what she gets at home consistent with white coat hypertension. She is currently managed on amlodipine  10 mg daily, chlorthalidone  25 mg daily, and losartan  50 mg daily.    [name] has a history of alopecia areata. She is currently managed on Litfulo  (ritlecitinib) 50 mg daily. She has had some response. Dr. Alm feels she will likely have better response with more time.  Past Medical History: Patient Active Problem List   Diagnosis Date Noted   Hypokalemia 01/01/2024   Resistant hypertension 11/18/2022   Degenerative tear of lateral meniscus of right knee 09/15/2022   Alopecia areata 03/31/2021   Past Surgical History:  Procedure Laterality Date   CESAREAN SECTION     COSMETIC SURGERY     Family History  Problem Relation Age of Onset   Diabetes Mother    Heart disease Mother        CHF   Hyperlipidemia Mother    Hypertension Mother    Cancer Father        prostate   Heart disease Maternal Uncle    Diabetes Maternal Uncle    Heart disease Maternal Grandmother    Hyperlipidemia Maternal Grandmother    Hypertension Maternal Grandmother    Heart disease Maternal Grandfather    Outpatient Medications Prior to Visit  Medication Sig Dispense Refill   amLODipine  (NORVASC ) 10 MG tablet Take 1 tablet (10 mg total) by mouth daily. 90 tablet 3   chlorthalidone  (HYGROTON ) 25 MG tablet TAKE 1 TABLET (25 MG TOTAL)  BY MOUTH DAILY. 90 tablet 3   losartan  (COZAAR ) 50 MG tablet TAKE 1 TABLET BY MOUTH EVERY DAY 90 tablet 3   potassium chloride  SA (KLOR-CON  M) 20 MEQ tablet Take 1 tablet (20 mEq total) by mouth daily. 90 tablet 3   Ritlecitinib Tosylate  (LITFULO ) 50 MG CAPS Take 1 capsule by mouth daily. 28 capsule 11   No facility-administered medications prior to visit.   Allergies[1]   Objective:   Today's Vitals   10/01/24 1556  BP: 138/82  Pulse: 93  Temp: 97.7 F (36.5 C)  TempSrc: Temporal  SpO2: 99%  Weight: 149 lb (67.6 kg)  Height: 5' 3 (1.6 m)   There is no height or weight on file to calculate BMI.   General: Well developed, well nourished. No acute distress. Psych: Alert and oriented. Normal mood and affect.  Health Maintenance Due  Topic Date Due   Hepatitis B Vaccines 19-59 Average Risk (1 of 3 - 19+ 3-dose series) Never done   Pneumococcal Vaccine: 50+ Years (1 of 1 - PCV) Never done   Influenza Vaccine  Never done   COVID-19 Vaccine (6 - 2025-26 season) 06/03/2024   Lab Results    Latest Ref Rng & Units 09/16/2024   12:29 PM 04/24/2024    3:04 PM 10/25/2019    4:11 PM  CBC  WBC 3.4 - 10.8 x10E3/uL  5.9  6.3  5.7   Hemoglobin 11.1 - 15.9 g/dL 88.7  88.7  88.7   Hematocrit 34.0 - 46.6 % 33.0  33.3  32.8   Platelets 150 - 450 x10E3/uL 221  202  162       Latest Ref Rng & Units 09/16/2024   12:29 PM 04/24/2024    3:04 PM 12/29/2023    3:36 PM  CMP  Glucose 70 - 99 mg/dL 87  79  73   BUN 6 - 24 mg/dL 13  11  16    Creatinine 0.57 - 1.00 mg/dL 9.16  9.00  9.29   Sodium 134 - 144 mmol/L 140  145  140   Potassium 3.5 - 5.2 mmol/L 3.8  3.6  3.1   Chloride 96 - 106 mmol/L 99  103  101   CO2 20 - 29 mmol/L 30  27  30    Calcium 8.7 - 10.2 mg/dL 89.3  89.7  89.8   Total Protein 6.0 - 8.5 g/dL 7.9  7.6    Total Bilirubin 0.0 - 1.2 mg/dL 0.3  0.5    Alkaline Phos 49 - 135 IU/L 74  70    AST 0 - 40 IU/L 24  18    ALT 0 - 32 IU/L 20  14     Last lipids Lab Results   Component Value Date   CHOL 222 (H) 09/16/2024   HDL 89 09/16/2024   LDLCALC 123 (H) 09/16/2024   TRIG 58 09/16/2024   CHOLHDL 2.5 09/16/2024      Assessment & Plan:   Problem List Items Addressed This Visit       Cardiovascular and Mediastinum   Resistant hypertension - Primary   BP is acceptable today. We discussed her obtaining a new home BP monitor. Continue amlodipine  10 mg daily, losartan  50 mg daily and chlorthalidone  25 mg daily.       Relevant Medications   losartan  (COZAAR ) 50 MG tablet     Musculoskeletal and Integument   Alopecia areata   Some early response. Continue Litfulo  (ritlecitinib) and following with Dr. Alm.        Other   Hypercalcemia   May be transient. Plan to reassess on her next blood draw.       Return in about 4 months (around 01/30/2025) for Reassessment.   Jenny CHRISTELLA Simpler, MD  I,Emily Lagle,acting as a scribe for Jenny CHRISTELLA Simpler, MD.,have documented all relevant documentation on the behalf of Jenny CHRISTELLA Simpler, MD.  I, Jenny CHRISTELLA Simpler, MD, have reviewed all documentation for this visit. The documentation on 10/01/2024 for the exam, diagnosis, procedures, and orders are all accurate and complete.     [1]  Allergies Allergen Reactions   Indapamide  Nausea Only   Lisinopril  Other (See Comments)    Double vision, photophobia, eyelid swelling   "

## 2024-10-18 ENCOUNTER — Ambulatory Visit: Admitting: Family Medicine

## 2024-10-22 ENCOUNTER — Encounter: Payer: Self-pay | Admitting: Dermatology

## 2024-10-23 ENCOUNTER — Other Ambulatory Visit: Payer: Self-pay | Admitting: Dermatology

## 2024-10-23 DIAGNOSIS — L639 Alopecia areata, unspecified: Secondary | ICD-10-CM
